# Patient Record
Sex: Male | Born: 1995 | Race: Black or African American | Hispanic: No | Marital: Single | Smoking: Current some day smoker
Health system: Southern US, Community
[De-identification: ages and names within clinical notes are randomized; demographics above are authoritative.]

## PROBLEM LIST (undated history)

## (undated) ENCOUNTER — Ambulatory Visit: Admission: EM | Payer: BC Managed Care – PPO | Source: Home / Self Care

## (undated) ENCOUNTER — Ambulatory Visit: Discharge status: Absent without leave

## (undated) DIAGNOSIS — A599 Trichomoniasis, unspecified: Secondary | ICD-10-CM

## (undated) DIAGNOSIS — N179 Acute kidney failure, unspecified: Secondary | ICD-10-CM

## (undated) DIAGNOSIS — R4589 Other symptoms and signs involving emotional state: Secondary | ICD-10-CM

## (undated) DIAGNOSIS — K219 Gastro-esophageal reflux disease without esophagitis: Secondary | ICD-10-CM

## (undated) DIAGNOSIS — N189 Chronic kidney disease, unspecified: Secondary | ICD-10-CM

## (undated) DIAGNOSIS — M6282 Rhabdomyolysis: Secondary | ICD-10-CM

## (undated) DIAGNOSIS — N451 Epididymitis: Secondary | ICD-10-CM

## (undated) HISTORY — PX: UPPER GI ENDOSCOPY: SHX6162

## (undated) HISTORY — PX: ELBOW SURGERY: SHX618

## (undated) HISTORY — DX: Trichomoniasis, unspecified: A59.9

---
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
## 2013-04-20 ENCOUNTER — Emergency Department (HOSPITAL_COMMUNITY)
Admission: EM | Admit: 2013-04-20 | Discharge: 2013-04-21 | Disposition: A | Discharge status: Home / Self Care | Payer: 59 | Attending: Emergency Medicine | Admitting: Emergency Medicine

## 2013-04-20 ENCOUNTER — Encounter (HOSPITAL_COMMUNITY): Payer: Self-pay | Admitting: Emergency Medicine

## 2013-04-20 DIAGNOSIS — IMO0002 Reserved for concepts with insufficient information to code with codable children: Secondary | ICD-10-CM | POA: Insufficient documentation

## 2013-04-20 DIAGNOSIS — S39848A Other specified injuries of external genitals, initial encounter: Secondary | ICD-10-CM | POA: Insufficient documentation

## 2013-04-20 DIAGNOSIS — Y9389 Activity, other specified: Secondary | ICD-10-CM | POA: Insufficient documentation

## 2013-04-20 DIAGNOSIS — Y929 Unspecified place or not applicable: Secondary | ICD-10-CM | POA: Insufficient documentation

## 2013-04-20 DIAGNOSIS — N453 Epididymo-orchitis: Secondary | ICD-10-CM | POA: Insufficient documentation

## 2013-04-20 DIAGNOSIS — S3994XA Unspecified injury of external genitals, initial encounter: Secondary | ICD-10-CM | POA: Insufficient documentation

## 2013-04-20 DIAGNOSIS — N451 Epididymitis: Secondary | ICD-10-CM

## 2013-04-20 LAB — URINALYSIS, ROUTINE W REFLEX MICROSCOPIC
Bilirubin Urine: NEGATIVE
GLUCOSE, UA: NEGATIVE mg/dL
HGB URINE DIPSTICK: NEGATIVE
Ketones, ur: NEGATIVE mg/dL
LEUKOCYTES UA: NEGATIVE
Nitrite: NEGATIVE
PROTEIN: NEGATIVE mg/dL
SPECIFIC GRAVITY, URINE: 1.029 (ref 1.005–1.030)
UROBILINOGEN UA: 0.2 mg/dL (ref 0.0–1.0)
pH: 5.5 (ref 5.0–8.0)

## 2013-04-20 NOTE — ED Notes (Signed)
The pt has had lt testicle pain and swelling since Saturday.  No voiding difficulty

## 2013-04-21 ENCOUNTER — Emergency Department (HOSPITAL_COMMUNITY): Payer: 59

## 2013-04-21 MED ORDER — IBUPROFEN 200 MG PO TABS
600.0000 mg | ORAL_TABLET | Freq: Once | ORAL | Status: AC
Start: 2013-04-21 — End: 2013-04-21
  Administered 2013-04-21: 600 mg via ORAL
  Filled 2013-04-21: qty 3

## 2013-04-21 MED ORDER — HYDROCODONE-ACETAMINOPHEN 5-325 MG PO TABS: 1.0000 | ORAL_TABLET | Freq: Four times a day (QID) | ORAL | Status: DC | PRN

## 2013-04-21 MED ORDER — IBUPROFEN 600 MG PO TABS: 600.0000 mg | ORAL_TABLET | Freq: Four times a day (QID) | ORAL | Status: DC | PRN

## 2013-04-21 NOTE — ED Provider Notes (Signed)
CSN: [id number]     Arrival date & time 04/20/13  2207 History   First MD Initiated Contact with Patient 04/21/13 0024     Chief Complaint  Patient presents with  . Testicle Pain     (Consider location/radiation/quality/duration/timing/severity/associated sxs/prior Treatment) HPI Comments: Pt comes in with cc of left testicle pain. Pt was doing some sports drill, and he recalls hitting his testicle on the ground. However, he started having pain on Saturday, associated with some swelling. There is no penile discharge, no uri like sx and no hx of same in the past. Pt has no hx of STD, denies any rashes and no risk factors for STD at this time.  Patient is a 18 y.o. male presenting with testicular pain. The history is provided by the patient.  Testicle Pain Pertinent negatives include no abdominal pain.    History reviewed. No pertinent past medical history. Past Surgical History  Procedure Laterality Date  . Elbow surgery     No family history on file. History  Substance Use Topics  . Smoking status: Never Smoker   . Smokeless tobacco: Not on file  . Alcohol Use: No    Review of Systems  Constitutional: Positive for activity change.  Gastrointestinal: Negative for nausea, vomiting and abdominal pain.  Genitourinary: Positive for scrotal swelling and testicular pain. Negative for dysuria, hematuria, discharge and penile swelling.  Skin: Negative for rash and wound.  Hematological: Does not bruise/bleed easily.      Allergies  Review of patient's allergies indicates no known allergies.  Home Medications  No current outpatient prescriptions on file. BP 124/91  Pulse 52  Temp(Src) 97.8 F (36.6 C) (Oral)  Resp 22  Ht 6\' 2"  (1.88 m)  Wt 245 lb (111.131 kg)  BMI 31.44 kg/m2  SpO2 100% Physical Exam  Nursing note and vitals reviewed. Constitutional: He appears well-developed.  HENT:  Head: Atraumatic.  Eyes: Conjunctivae are normal.  Pulmonary/Chest: Effort normal  and breath sounds normal.  Abdominal: Soft. He exhibits no distension. There is no tenderness.  Genitourinary: Penis normal. No penile tenderness.  Left testicles is edematous and tender to palpation. Inguinal hernia check shoes no palpable mass    ED Course  Procedures (including critical care time) Labs Review Labs Reviewed  URINALYSIS, ROUTINE W REFLEX MICROSCOPIC   Imaging Review No results found.   EKG Interpretation None      MDM   Final diagnoses:  None    Pt comes in with testicular pain. States that he had trauma to the scrotum on Thursday, during practice drills. Pt has no penile discharge, uti like sx, hx of STD, and no risk factors for the same. US shows epididymitis. He re-endorses no risk for STDs and doesn't want tx. Will d/c as traumatic epididymitis.   Derwood KaplanAnkit Lyllie Cobbins, MD 04/21/13 [phone]

## 2013-04-21 NOTE — Discharge Instructions (Signed)
Use tight briefs/drawers, DONT USE LOOSE BOXERS. Ice the affected scrotum. Take motrin every 6 hours for the next 2 days. See Urologist if not better. Come to the ER if there is any fevers.   Epididymitis Epididymitis is a swelling (inflammation) of the epididymis. The epididymis is a cord-like structure along the back part of the testicle. Epididymitis is usually, but not always, caused by infection. This is usually a sudden problem beginning with chills, fever and pain behind the scrotum and in the testicle. There may be swelling and redness of the testicle. DIAGNOSIS  Physical examination will reveal a tender, swollen epididymis. Sometimes, cultures are obtained from the urine or from prostate secretions to help find out if there is an infection or if the cause is a different problem. Sometimes, blood work is performed to see if your white blood cell count is elevated and if a germ (bacterial) or viral infection is present. Using this knowledge, an appropriate medicine which kills germs (antibiotic) can be chosen by your caregiver. A viral infection causing epididymitis will most often go away (resolve) without treatment. HOME CARE INSTRUCTIONS   Hot sitz baths for 20 minutes, 4 times per day, may help relieve pain.  Only take over-the-counter or prescription medicines for pain, discomfort or fever as directed by your caregiver.  Take all medicines, including antibiotics, as directed. Take the antibiotics for the full prescribed length of time even if you are feeling better.  It is very important to keep all follow-up appointments. SEEK IMMEDIATE MEDICAL CARE IF:   You have a fever.  You have pain not relieved with medicines.  You have any worsening of your problems.  Your pain seems to come and go.  You develop pain, redness, and swelling in the scrotum and surrounding areas. MAKE SURE YOU:   Understand these instructions.  Will watch your condition.  Will get help right away  if you are not doing well or get worse. Document Released: 01/21/2000 Document Revised: 04/17/2011 Document Reviewed: 12/10/2008 [facility] Patient Information 2014 [employer] MolinaExitCare, [place].

## 2013-04-21 NOTE — ED Notes (Signed)
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 States that he started experiencing pain and tenderness Saturday morning. States that she left testicle hurts worse and has gotten swollen, states that there is no redness.

## 2013-04-23 ENCOUNTER — Other Ambulatory Visit (HOSPITAL_COMMUNITY)
Admission: RE | Admit: 2013-04-23 | Discharge: 2013-04-23 | Disposition: A | Discharge status: Home / Self Care | Payer: 59 | Source: Ambulatory Visit | Attending: Family Medicine | Admitting: Family Medicine

## 2013-04-23 ENCOUNTER — Encounter (HOSPITAL_COMMUNITY): Payer: Self-pay | Admitting: Emergency Medicine

## 2013-04-23 ENCOUNTER — Emergency Department (INDEPENDENT_AMBULATORY_CARE_PROVIDER_SITE_OTHER)
Admission: EM | Admit: 2013-04-23 | Discharge: 2013-04-23 | Disposition: A | Discharge status: Home / Self Care | Payer: 59 | Source: Home / Self Care | Attending: Family Medicine | Admitting: Family Medicine

## 2013-04-23 DIAGNOSIS — N453 Epididymo-orchitis: Secondary | ICD-10-CM

## 2013-04-23 DIAGNOSIS — N451 Epididymitis: Secondary | ICD-10-CM

## 2013-04-23 DIAGNOSIS — Z113 Encounter for screening for infections with a predominantly sexual mode of transmission: Secondary | ICD-10-CM | POA: Insufficient documentation

## 2013-04-23 MED ORDER — AZITHROMYCIN 250 MG PO TABS
1000.0000 mg | ORAL_TABLET | Freq: Once | ORAL | Status: AC
  Administered 2013-04-23: 1000 mg via ORAL

## 2013-04-23 MED ORDER — CEFTRIAXONE SODIUM 250 MG IJ SOLR
INTRAMUSCULAR | Status: AC
  Filled 2013-04-23: qty 250

## 2013-04-23 MED ORDER — AZITHROMYCIN 250 MG PO TABS
ORAL_TABLET | ORAL | Status: AC
  Filled 2013-04-23: qty 4

## 2013-04-23 MED ORDER — LIDOCAINE HCL (PF) 1 % IJ SOLN
INTRAMUSCULAR | Status: AC
  Filled 2013-04-23: qty 5

## 2013-04-23 MED ORDER — CEFTRIAXONE SODIUM 250 MG IJ SOLR
250.0000 mg | Freq: Once | INTRAMUSCULAR | Status: AC
  Administered 2013-04-23: 250 mg via INTRAMUSCULAR

## 2013-04-23 NOTE — ED Provider Notes (Signed)
Caleb ClayJustin Ducksworth is a 18 y.o. male who presents to Urgent Care today for left testicle pain and swelling. Patient was seen in the emergency room March 15 and diagnosed with epididymitis based on ultrasound. He did not receive diagnosis or treatment. His symptoms persist. He denies any penile discharge fevers chills nausea vomiting or diarrhea. He is sexually active with women. He denies any penile lesions.  Patient is a [employer].   History reviewed. No pertinent past medical history. History  Substance Use Topics  . Smoking status: Never Smoker   . Smokeless tobacco: Not on file  . Alcohol Use: No   ROS as above Medications: Current Facility-Administered Medications  Medication Dose Route Frequency Provider Last Rate Last Dose  . azithromycin (ZITHROMAX) tablet 1,000 mg  1,000 mg Oral Once Rodolph BongEvan S Kenyata Napier, MD      . cefTRIAXone (ROCEPHIN) injection 250 mg  250 mg Intramuscular Once Rodolph BongEvan S Cariann Kinnamon, MD       Current Outpatient Prescriptions  Medication Sig Dispense Refill  . HYDROcodone-acetaminophen (NORCO/VICODIN) 5-325 MG per tablet Take 1 tablet by mouth every 6 (six) hours as needed.  8 tablet  0  . ibuprofen (ADVIL,MOTRIN) 600 MG tablet Take 1 tablet (600 mg total) by mouth every 6 (six) hours as needed.  30 tablet  0    Exam:  BP 131/81  Pulse 56  Temp(Src) 97.1 F (36.2 C) (Oral) Gen: Well NAD Genitals: Normal appearing circumcised penis without discharge or lesions Testicles: Right normal descended nontender Left: Swollen and tender. No masses palpated  Assessment and Plan: 18 y.o. male with epididymitis. Urine cytology pending for gonorrhea, Chlamydia, trichomonas. Treatment with empiric ceftriaxone and azithromycin. [facility]  [employer]   Discussed warning signs or symptoms. Please see discharge instructions. Patient expresses understanding.    Rodolph BongEvan S Galen Malkowski, MD 04/23/13 2019

## 2013-04-23 NOTE — Discharge Instructions (Signed)
Thank you for coming in today. Followup with the team physician Come back as needed

## 2013-04-23 NOTE — ED Notes (Signed)
Pt c/o left testicle pain and swelling since 04/19/13 [facility] and dx w/Epididymitis Given hydrocodone for the pain Denies penile d/c, dysuria, f/v/n/d Alert w/no signs of acute distress.

## 2013-04-24 LAB — URINE CYTOLOGY ANCILLARY ONLY
CHLAMYDIA, DNA PROBE: POSITIVE — AB
NEISSERIA GONORRHEA: NEGATIVE
Trichomonas: NEGATIVE

## 2013-04-25 ENCOUNTER — Telehealth (HOSPITAL_COMMUNITY): Payer: Self-pay | Admitting: *Deleted

## 2013-04-25 NOTE — ED Notes (Signed)
GC and Trich neg., Chlamydia pos.  Pt. adequately treated with Zithromax and also got Rocephin.  I called pt. and left a message to call.  Call 1. Caleb Morrison, Caleb Morrison M 04/25/2013

## 2014-10-20 ENCOUNTER — Encounter (HOSPITAL_COMMUNITY): Payer: Self-pay | Admitting: Emergency Medicine

## 2014-10-20 ENCOUNTER — Emergency Department (HOSPITAL_COMMUNITY)
Admission: EM | Admit: 2014-10-20 | Discharge: 2014-10-20 | Disposition: A | Discharge status: Home / Self Care | Payer: Commercial Managed Care - HMO | Attending: Emergency Medicine | Admitting: Emergency Medicine

## 2014-10-20 DIAGNOSIS — R42 Dizziness and giddiness: Secondary | ICD-10-CM | POA: Diagnosis not present

## 2014-10-20 DIAGNOSIS — E860 Dehydration: Secondary | ICD-10-CM | POA: Diagnosis not present

## 2014-10-20 DIAGNOSIS — M542 Cervicalgia: Secondary | ICD-10-CM | POA: Diagnosis not present

## 2014-10-20 LAB — BASIC METABOLIC PANEL
Anion gap: 11 (ref 5–15)
BUN: 23 mg/dL — ABNORMAL HIGH (ref 6–20)
CO2: 24 mmol/L (ref 22–32)
Calcium: 9.5 mg/dL (ref 8.9–10.3)
Chloride: 101 mmol/L (ref 101–111)
Creatinine, Ser: 2.27 mg/dL — ABNORMAL HIGH (ref 0.61–1.24)
GFR calc Af Amer: 46 mL/min — ABNORMAL LOW (ref >60)
GFR calc non Af Amer: 39 mL/min — ABNORMAL LOW (ref >60)
Glucose, Bld: 85 mg/dL (ref 65–99)
Potassium: 4.3 mmol/L (ref 3.5–5.1)
Sodium: 136 mmol/L (ref 135–145)

## 2014-10-20 LAB — I-STAT CREATININE, ED: Creatinine, Ser: 1.9 mg/dL — ABNORMAL HIGH (ref 0.61–1.24)

## 2014-10-20 LAB — CBC WITH DIFFERENTIAL/PLATELET
Basophils Absolute: 0 10*3/uL (ref 0.0–0.1)
Basophils Relative: 0 % (ref 0–1)
Eosinophils Absolute: 0.1 10*3/uL (ref 0.0–0.7)
Eosinophils Relative: 1 % (ref 0–5)
HCT: 48.8 % (ref 39.0–52.0)
Hemoglobin: 17.1 g/dL — ABNORMAL HIGH (ref 13.0–17.0)
Lymphocytes Relative: 13 % (ref 12–46)
Lymphs Abs: 1.7 10*3/uL (ref 0.7–4.0)
MCH: 32.2 pg (ref 26.0–34.0)
MCHC: 35 g/dL (ref 30.0–36.0)
MCV: 91.9 fL (ref 78.0–100.0)
Monocytes Absolute: 0.9 10*3/uL (ref 0.1–1.0)
Monocytes Relative: 7 % (ref 3–12)
Neutro Abs: 9.9 10*3/uL — ABNORMAL HIGH (ref 1.7–7.7)
Neutrophils Relative %: 79 % — ABNORMAL HIGH (ref 43–77)
Platelets: 292 10*3/uL (ref 150–400)
RBC: 5.31 MIL/uL (ref 4.22–5.81)
RDW: 12.2 % (ref 11.5–15.5)
WBC: 12.7 10*3/uL — ABNORMAL HIGH (ref 4.0–10.5)

## 2014-10-20 LAB — URINALYSIS, ROUTINE W REFLEX MICROSCOPIC
Bilirubin Urine: NEGATIVE
Glucose, UA: NEGATIVE mg/dL
Hgb urine dipstick: NEGATIVE
Ketones, ur: 15 mg/dL — AB
Nitrite: NEGATIVE
Protein, ur: NEGATIVE mg/dL
Specific Gravity, Urine: 1.022 (ref 1.005–1.030)
Urobilinogen, UA: 0.2 mg/dL (ref 0.0–1.0)
pH: 5.5 (ref 5.0–8.0)

## 2014-10-20 LAB — URINE MICROSCOPIC-ADD ON

## 2014-10-20 LAB — CK: Total CK: 1169 U/L — ABNORMAL HIGH (ref 49–397)

## 2014-10-20 MED ORDER — SODIUM CHLORIDE 0.9 % IV BOLUS (SEPSIS)
2000.0000 mL | Freq: Once | INTRAVENOUS | Status: AC
  Administered 2014-10-20: 1000 mL via INTRAVENOUS

## 2014-10-20 MED ORDER — SODIUM CHLORIDE 0.9 % IV BOLUS (SEPSIS)
1000.0000 mL | Freq: Once | INTRAVENOUS | Status: AC
  Administered 2014-10-20: 1000 mL via INTRAVENOUS

## 2014-10-20 NOTE — ED Notes (Signed)
Pt is alert and oriented , states he feels much better,  I discussed at length the importance of staying hydrated and no caffeine, pt and his coach understands

## 2014-10-20 NOTE — ED Notes (Signed)
Pt comes to Ed via EMS, complains of fainting and full body cramping. Pt was in extreme pain, and EMS gave 250 fentyal and started NS for a total of 1500Ns. Pt pulled out 16 gage r arm IV, and new one was placed. Pt now has a 20 r hand. No chest pain or SOB.

## 2014-10-20 NOTE — ED Notes (Signed)
Pt is alert and oriented ,  IV fluid was slow , so I put a pressure infuser on to facilitate normal saline

## 2014-10-20 NOTE — ED Provider Notes (Signed)
CSN: [id number]     Arrival date & time 10/20/14  1824 History   First MD Initiated Contact with Patient 10/20/14 1833     Chief Complaint  Patient presents with  . Dehydration     (Consider location/radiation/quality/duration/timing/severity/associated sxs/prior Treatment) HPI  Caleb Morrison is a 19 yo male who presents with an episode of muscle cramping and extreme pain, which has since resolved. Patient states that just before 5 he was at football practice and began to feel lightheaded. He then noticed some cramping that started in his neck then made its way down to his toes. This episode lasted 20-25 minutes. He was able to walk around at first but then once it made its way to his feet he had to lie down on the ground. He was given salt tablets, which did not help his symptoms. When his symptoms had not resolved after 20 minutes the athletic trainer called EMS. He was given Fentanyl by EMS. He is not currently in any pain.   History reviewed. No pertinent past medical history. Past Surgical History  Procedure Laterality Date  . Elbow surgery     History reviewed. No pertinent family history. Social History  Substance Use Topics  . Smoking status: Never Smoker   . Smokeless tobacco: None  . Alcohol Use: No    Review of Systems  All other systems negative except as documented in the HPI. All pertinent positives and negatives as reviewed in the HPI.  Allergies  Review of patient's allergies indicates no known allergies.  Home Medications   Prior to Admission medications   Medication Sig Start Date End Date Taking? Authorizing Provider  HYDROcodone-acetaminophen (NORCO/VICODIN) 5-325 MG per tablet Take 1 tablet by mouth every 6 (six) hours as needed. Patient not taking: Reported on 10/20/2014 04/21/13   Derwood Kaplan, MD  ibuprofen (ADVIL,MOTRIN) 600 MG tablet Take 1 tablet (600 mg total) by mouth every 6 (six) hours as needed. Patient not taking: Reported on 10/20/2014 04/21/13    Derwood Kaplan, MD   BP 134/76 mmHg  Pulse 92  Temp(Src) 97.7 F (36.5 C) (Axillary)  Resp 18  Ht 6' 1.5" (1.867 m)  Wt 245 lb (111.131 kg)  BMI 31.88 kg/m2  SpO2 99% Physical Exam  Constitutional: He is oriented to person, place, and time. He appears well-developed and well-nourished. No distress.  HENT:  Head: Normocephalic and atraumatic.  Mouth/Throat: Oropharynx is clear and moist.  Eyes: Pupils are equal, round, and reactive to light.  Neck: Normal range of motion. Neck supple.  Cardiovascular: Normal rate, regular rhythm and normal heart sounds.  Exam reveals no gallop and no friction rub.   No murmur heard. Pulmonary/Chest: Effort normal and breath sounds normal. No respiratory distress.  Neurological: He is alert and oriented to person, place, and time. He exhibits normal muscle tone. Coordination normal.  Skin: Skin is warm and dry. No erythema.  Psychiatric: He has a normal mood and affect. His behavior is normal.  Nursing note and vitals reviewed.   ED Course  Procedures (including critical care time) Labs Review Labs Reviewed  BASIC METABOLIC PANEL - Abnormal; Notable for the following:    BUN 23 (*)    Creatinine, Ser 2.27 (*)    GFR calc non Af Amer 39 (*)    GFR calc Af Amer 46 (*)    All other components within normal limits  CBC WITH DIFFERENTIAL/PLATELET - Abnormal; Notable for the following:    WBC 12.7 (*)    Hemoglobin  17.1 (*)    Neutrophils Relative % 79 (*)    Neutro Abs 9.9 (*)    All other components within normal limits  CK - Abnormal; Notable for the following:    Total CK 1169 (*)    All other components within normal limits  URINALYSIS, ROUTINE W REFLEX MICROSCOPIC (NOT AT [facility])    Imaging Review No results found. I have personally reviewed and evaluated these images and lab results as part of my medical decision-making.  Feeling vastly improved.  I advised him that he will need to follow up with the team doctor probably not practice  tomorrow.  His creatinine is improving.  His CK was 1169.  The patient is advised to return here for any worsening in his condition.  I do not believe the patient is in rhabdo at this time.  The patient will need clearance from the team doctor.  Patient voices an understanding and his numbers are improving following IV fluids.  He is received almost 4L of normal saline    Charlestine Night, PA-C 10/24/14 0100  Charlestine Night, PA-C 10/24/14 0100  Lorre Nick, MD 10/27/14 1227

## 2014-10-20 NOTE — ED Notes (Signed)
Abnormal I stat creat was given to Dr. Freida Busman.

## 2014-10-20 NOTE — ED Notes (Signed)
Caleb Bible  [phone]  Medical staff from school call when pt is discharged

## 2014-10-20 NOTE — Discharge Instructions (Signed)
Follow-up at the team physician.  I would increase your fluid intake with Gatorade and water.  Return here as needed

## 2014-10-20 NOTE — ED Notes (Signed)
Istat to mini lab, [facility] aware

## 2015-02-15 ENCOUNTER — Other Ambulatory Visit (HOSPITAL_COMMUNITY)
Admission: RE | Admit: 2015-02-15 | Discharge: 2015-02-15 | Disposition: A | Discharge status: Home / Self Care | Payer: Commercial Managed Care - HMO | Source: Ambulatory Visit | Attending: Family Medicine | Admitting: Family Medicine

## 2015-02-15 ENCOUNTER — Encounter (HOSPITAL_COMMUNITY): Payer: Self-pay | Admitting: *Deleted

## 2015-02-15 ENCOUNTER — Emergency Department (INDEPENDENT_AMBULATORY_CARE_PROVIDER_SITE_OTHER)
Admission: EM | Admit: 2015-02-15 | Discharge: 2015-02-15 | Disposition: A | Discharge status: Home / Self Care | Payer: Commercial Managed Care - HMO | Source: Home / Self Care | Attending: Family Medicine | Admitting: Family Medicine

## 2015-02-15 DIAGNOSIS — Z113 Encounter for screening for infections with a predominantly sexual mode of transmission: Secondary | ICD-10-CM | POA: Insufficient documentation

## 2015-02-15 DIAGNOSIS — Z202 Contact with and (suspected) exposure to infections with a predominantly sexual mode of transmission: Secondary | ICD-10-CM

## 2015-02-15 DIAGNOSIS — Z711 Person with feared health complaint in whom no diagnosis is made: Secondary | ICD-10-CM

## 2015-02-15 MED ORDER — CEFTRIAXONE SODIUM 250 MG IJ SOLR
INTRAMUSCULAR | Status: AC
  Filled 2015-02-15: qty 250

## 2015-02-15 MED ORDER — AZITHROMYCIN 250 MG PO TABS
ORAL_TABLET | ORAL | Status: AC
  Filled 2015-02-15: qty 4

## 2015-02-15 MED ORDER — AZITHROMYCIN 250 MG PO TABS
1000.0000 mg | ORAL_TABLET | Freq: Once | ORAL | Status: AC
  Administered 2015-02-15: 1000 mg via ORAL

## 2015-02-15 MED ORDER — CEFTRIAXONE SODIUM 250 MG IJ SOLR
250.0000 mg | Freq: Once | INTRAMUSCULAR | Status: AC
  Administered 2015-02-15: 250 mg via INTRAMUSCULAR

## 2015-02-15 NOTE — ED Notes (Signed)
Phone  Number  Verified          Plan of  Care  Discussed   With pt         Pt  Verbalizes knowledge  Of plan of care

## 2015-02-15 NOTE — ED Provider Notes (Signed)
CSN: [id number]     Arrival date & time 02/15/15  1337 History   First MD Initiated Contact with Patient 02/15/15 1504     Chief Complaint  Patient presents with  . SEXUALLY TRANSMITTED DISEASE   (Consider location/radiation/quality/duration/timing/severity/associated sxs/prior Treatment) Patient is a 20 y.o. male presenting with STD exposure. The history is provided by the patient.  Exposure to STD This is a new problem. The current episode started 2 days ago (told by girl that she had chlamydia and pt here for check, took condom off at her request.). The problem has not changed since onset.Pertinent negatives include no abdominal pain.    History reviewed. No pertinent past medical history. Past Surgical History  Procedure Laterality Date  . Elbow surgery     No family history on file. Social History  Substance Use Topics  . Smoking status: Never Smoker   . Smokeless tobacco: None  . Alcohol Use: No    Review of Systems  Constitutional: Negative.   Gastrointestinal: Negative.  Negative for abdominal pain.  Genitourinary: Positive for testicular pain. Negative for dysuria, urgency, discharge, penile swelling, scrotal swelling, genital sores and penile pain.  Musculoskeletal: Negative.   All other systems reviewed and are negative.   Allergies  Review of patient's allergies indicates no known allergies.  Home Medications   Prior to Admission medications   Medication Sig Start Date End Date Taking? Authorizing Provider  HYDROcodone-acetaminophen (NORCO/VICODIN) 5-325 MG per tablet Take 1 tablet by mouth every 6 (six) hours as needed. Patient not taking: Reported on 10/20/2014 04/21/13   Derwood KaplanAnkit Nanavati, MD  ibuprofen (ADVIL,MOTRIN) 600 MG tablet Take 1 tablet (600 mg total) by mouth every 6 (six) hours as needed. Patient not taking: Reported on 10/20/2014 04/21/13   Derwood KaplanAnkit Nanavati, MD   Meds Ordered and Administered this Visit   Medications  cefTRIAXone (ROCEPHIN) injection  250 mg (not administered)  azithromycin (ZITHROMAX) tablet 1,000 mg (not administered)    BP 158/81 mmHg  Pulse 71  Temp(Src) 97.9 F (36.6 C) (Oral)  Resp 20  SpO2 97% No data found.   Physical Exam  Constitutional: He is oriented to person, place, and time. He appears well-developed and well-nourished. No distress.  Abdominal: Soft. Bowel sounds are normal.  Genitourinary: Prostate normal and penis normal. No penile tenderness.  Neurological: He is alert and oriented to person, place, and time.  Skin: Skin is warm and dry.  Nursing note and vitals reviewed.   ED Course  Procedures (including critical care time)  Labs Review Labs Reviewed  CYTOLOGY, (ORAL, ANAL, URETHRAL) ANCILLARY ONLY    Imaging Review No results found.   Visual Acuity Review  Right Eye Distance:   Left Eye Distance:   Bilateral Distance:    Right Eye Near:   Left Eye Near:    Bilateral Near:         MDM   1. Concern about STD in male without diagnosis       Linna HoffJames D Bular Hickok, MD 02/15/15 1530

## 2015-02-15 NOTE — ED Notes (Signed)
Pt  Reports  Some  Pain in  His  l   Testicle         X  2  Days       denys  Any  Discharge           -   Pt  Reports            Was  Told  He  May  Have  Been  Exposed  To  An  std

## 2015-02-16 LAB — CYTOLOGY, (ORAL, ANAL, URETHRAL) ANCILLARY ONLY
CHLAMYDIA, DNA PROBE: NEGATIVE
Neisseria Gonorrhea: NEGATIVE

## 2015-11-24 ENCOUNTER — Ambulatory Visit (HOSPITAL_COMMUNITY)
Admission: EM | Admit: 2015-11-24 | Discharge: 2015-11-24 | Disposition: A | Discharge status: Home / Self Care | Payer: Commercial Managed Care - HMO | Attending: Family Medicine | Admitting: Family Medicine

## 2015-11-24 ENCOUNTER — Encounter (HOSPITAL_COMMUNITY): Payer: Self-pay | Admitting: Family Medicine

## 2015-11-24 DIAGNOSIS — N50811 Right testicular pain: Secondary | ICD-10-CM | POA: Insufficient documentation

## 2015-11-24 DIAGNOSIS — N50812 Left testicular pain: Secondary | ICD-10-CM | POA: Diagnosis not present

## 2015-11-24 DIAGNOSIS — R1030 Lower abdominal pain, unspecified: Secondary | ICD-10-CM | POA: Insufficient documentation

## 2015-11-24 LAB — POCT URINALYSIS DIP (DEVICE)
BILIRUBIN URINE: NEGATIVE
Glucose, UA: NEGATIVE mg/dL
HGB URINE DIPSTICK: NEGATIVE
Ketones, ur: NEGATIVE mg/dL
Leukocytes, UA: NEGATIVE
NITRITE: NEGATIVE
PH: 6 (ref 5.0–8.0)
Protein, ur: NEGATIVE mg/dL
Specific Gravity, Urine: 1.025 (ref 1.005–1.030)
UROBILINOGEN UA: 0.2 mg/dL (ref 0.0–1.0)

## 2015-11-24 NOTE — ED Provider Notes (Signed)
CSN: [id number]     Arrival date & time 11/24/15  1057 History   First MD Initiated Contact with Patient 11/24/15 1156     Chief Complaint  Patient presents with  . Groin Pain   (Consider location/radiation/quality/duration/timing/severity/associated sxs/prior Treatment) Caleb Morrison is healthy 20 y.o male, presents today for 4-day history of intermittent low abdominal pain and left testicular pain. Patient reports that he had an unprotected sex 4 days ago with a new partner and symptoms started soon after the sexual encounter. He reports the pain to be aching and tingling.  He states that his R testicle also "kind of" hurts but mostly in the left testicle. He reports the pain at the testicle is only "a little bit". He denies penile discharge, penile pain, penile swelling, testicular swelling or redness. He denies dysuria.       History reviewed. No pertinent past medical history. Past Surgical History:  Procedure Laterality Date  . ELBOW SURGERY     History reviewed. No pertinent family history. Social History  Substance Use Topics  . Smoking status: Never Smoker  . Smokeless tobacco: Never Used  . Alcohol use No    Review of Systems  Constitutional: Negative for chills, fatigue and fever.  Respiratory: Negative for cough and shortness of breath.   Cardiovascular: Negative for chest pain, palpitations and leg swelling.  Gastrointestinal: Positive for abdominal pain. Negative for diarrhea, nausea and vomiting.  Genitourinary: Positive for testicular pain. Negative for decreased urine volume, difficulty urinating, discharge, dysuria, flank pain, penile pain, penile swelling, scrotal swelling and urgency.  Neurological: Negative for dizziness and headaches.    Allergies  Review of patient's allergies indicates no known allergies.  Home Medications   Prior to Admission medications   Not on File   Meds Ordered and Administered this Visit  Medications - No data to display  BP  130/98   Pulse 69   Temp 98.1 F (36.7 C)   Resp 18   SpO2 97%  No data found.   Physical Exam  Constitutional: He is oriented to person, place, and time. He appears well-developed and well-nourished.  HENT:  Head: Normocephalic and atraumatic.  Eyes: Pupils are equal, round, and reactive to light.  Neck: Normal range of motion. Neck supple.  Cardiovascular: Normal rate, regular rhythm and normal heart sounds.   Pulmonary/Chest: Effort normal and breath sounds normal. No respiratory distress. He has no wheezes.  Abdominal: Soft. Bowel sounds are normal. He exhibits no mass. There is tenderness.  Mild tenderness over the lower abdomen  Genitourinary:  Genitourinary Comments: No penile discharge, rash, redness or swelling. No testicular swelling or redness. Does endorses some pain over the L testicle upon palpation. No testicular mass palpated.   Lymphadenopathy:    He has no cervical adenopathy.  Neurological: He is alert and oriented to person, place, and time.  Nursing note and vitals reviewed.   Urgent Care Course   Clinical Course    Procedures (including critical care time)  Labs Review Labs Reviewed  POCT URINALYSIS DIP (DEVICE)  URINE CYTOLOGY ANCILLARY ONLY    Imaging Review No results found.  MDM   1. Lower abdominal pain   2. Testicular pain, left   3. Testicular pain, right     1) Patient in no acute distress. Genitourinary exam unremarkable.  2) UA negative today. Cytology result pending. Informed that we will only call him if the results are positive.  3) Patient is safe to be discharged home. Informed to  continue to monitor for symptoms.  4) Emphasized that if he gets worst with testicular swelling, redness or worsening in pain, to go to the ER.   Lucia EstelleFeng Penelopi Mikrut, NP 11/24/15 1251

## 2015-11-24 NOTE — ED Triage Notes (Signed)
Pt here for groin pain and tingling in penis. sts he had unprotected sex 4 days ago. Denies any discharge from penis.

## 2015-11-25 LAB — URINE CYTOLOGY ANCILLARY ONLY
Chlamydia: NEGATIVE
NEISSERIA GONORRHEA: NEGATIVE

## 2016-04-19 ENCOUNTER — Encounter (HOSPITAL_COMMUNITY): Payer: Self-pay | Admitting: Family Medicine

## 2016-04-19 ENCOUNTER — Ambulatory Visit (HOSPITAL_COMMUNITY)
Admission: EM | Admit: 2016-04-19 | Discharge: 2016-04-19 | Disposition: A | Discharge status: Home / Self Care | Payer: Commercial Managed Care - HMO | Attending: Family Medicine | Admitting: Family Medicine

## 2016-04-19 DIAGNOSIS — J039 Acute tonsillitis, unspecified: Secondary | ICD-10-CM

## 2016-04-19 DIAGNOSIS — J029 Acute pharyngitis, unspecified: Secondary | ICD-10-CM | POA: Diagnosis not present

## 2016-04-19 DIAGNOSIS — J00 Acute nasopharyngitis [common cold]: Secondary | ICD-10-CM

## 2016-04-19 DIAGNOSIS — R1310 Dysphagia, unspecified: Secondary | ICD-10-CM | POA: Insufficient documentation

## 2016-04-19 LAB — POCT RAPID STREP A: Streptococcus, Group A Screen (Direct): NEGATIVE

## 2016-04-19 MED ORDER — AMOXICILLIN 500 MG PO CAPS: 1000.0000 mg | ORAL_CAPSULE | Freq: Two times a day (BID) | ORAL | 0 refills | Status: DC

## 2016-04-19 MED ORDER — PREDNISONE 50 MG PO TABS: ORAL_TABLET | ORAL | 0 refills | Status: DC

## 2016-04-19 NOTE — ED Triage Notes (Signed)
Pt here for sore throat since Sunday

## 2016-04-19 NOTE — Discharge Instructions (Signed)
Take the prednisone for the first 3 days. After that you may start ibuprofen 600 mg every 6 hours as needed for throat pain. You may start taking Tylenol for pain as well. Cepacol lozenges to help with throat pain. Start taking the amoxicillin today.

## 2016-04-19 NOTE — ED Provider Notes (Signed)
CSN: [id number]     Arrival date & time 04/19/16  1207 History   First MD Initiated Contact with Patient 04/19/16 1404     Chief Complaint  Patient presents with  . Sore Throat   (Consider location/radiation/quality/duration/timing/severity/associated sxs/prior Treatment) 21 year old male complaining of sore throat with enlarged tonsils and odynophagia. He is also had some sniffles and runny nose.      History reviewed. No pertinent past medical history. Past Surgical History:  Procedure Laterality Date  . ELBOW SURGERY     History reviewed. No pertinent family history. Social History  Substance Use Topics  . Smoking status: Never Smoker  . Smokeless tobacco: Never Used  . Alcohol use No    Review of Systems  Constitutional: Positive for activity change. Negative for chills and fever.  HENT: Positive for congestion, rhinorrhea and sore throat.   Eyes: Negative.   Respiratory: Negative.   Cardiovascular: Negative.   Neurological: Negative.     Allergies  Patient has no known allergies.  Home Medications   Prior to Admission medications   Medication Sig Start Date End Date Taking? Authorizing Provider  amoxicillin (AMOXIL) 500 MG capsule Take 2 capsules (1,000 mg total) by mouth 2 (two) times daily. 04/19/16   Hayden Rasmussenavid Emeril Stille, NP  predniSONE (DELTASONE) 50 MG tablet 1 tab po on day 1, one tab po on day 2, 1/2 tab po on days 3 and 4. Take with food. 04/19/16   Hayden Rasmussenavid Nikira Kushnir, NP   Meds Ordered and Administered this Visit  Medications - No data to display  BP 154/91   Pulse 62   Temp 98.2 F (36.8 C)   Resp 18   SpO2 99%  No data found.   Physical Exam  Constitutional: He is oriented to person, place, and time. He appears well-developed and well-nourished. No distress.  HENT:  Right Ear: External ear normal.  Left Ear: External ear normal.  Bilateral TMs are retracted. No erythema. Oropharynx with erythematous enlarged palatine tonsils covered with gray exudates.  Airway widely patent. Posterior pharynx erythematous and scant clear PND.  Eyes: EOM are normal.  Neck: Normal range of motion. Neck supple. No JVD present.  Cardiovascular: Normal rate, regular rhythm, normal heart sounds and intact distal pulses.   Pulmonary/Chest: Effort normal and breath sounds normal. No respiratory distress.  Musculoskeletal: Normal range of motion. He exhibits no edema.  Lymphadenopathy:    He has no cervical adenopathy.  Neurological: He is alert and oriented to person, place, and time.  Skin: Skin is warm and dry.  Nursing note and vitals reviewed.   Urgent Care Course     Procedures (including critical care time)  Labs Review Labs Reviewed  POCT RAPID STREP A   Results for orders placed or performed during the hospital encounter of 04/19/16  POCT rapid strep A [facility])  Result Value Ref Range   Streptococcus, Group A Screen (Direct) NEGATIVE NEGATIVE     Imaging Review No results found.   Visual Acuity Review  Right Eye Distance:   Left Eye Distance:   Bilateral Distance:    Right Eye Near:   Left Eye Near:    Bilateral Near:         MDM   1. Pharyngitis, unspecified etiology   2. Acute nasopharyngitis   3. Tonsillitis    Take the prednisone for the first 3 days. After that you may start ibuprofen 600 mg every 6 hours as needed for throat pain. You may start taking  Tylenol for pain as well. Cepacol lozenges to help with throat pain. Start taking the amoxicillin today. Meds ordered this encounter  Medications  . amoxicillin (AMOXIL) 500 MG capsule    Sig: Take 2 capsules (1,000 mg total) by mouth 2 (two) times daily.    Dispense:  28 capsule    Refill:  0    Order Specific Question:   Supervising Provider    Answer:   Tyrone Nine [id number]  . predniSONE (DELTASONE) 50 MG tablet    Sig: 1 tab po on day 1, one tab po on day 2, 1/2 tab po on days 3 and 4. Take with food.    Dispense:  3 tablet    Refill:  0    Order  Specific Question:   Supervising Provider    Answer:   Candie Mile       Hayden Rasmussen, NP 04/19/16 1417    Hayden Rasmussen, NP 04/19/16 1422

## 2016-04-21 LAB — CULTURE, GROUP A STREP (THRC)

## 2016-04-25 ENCOUNTER — Encounter (HOSPITAL_COMMUNITY): Payer: Self-pay | Admitting: Adult Health

## 2016-04-25 ENCOUNTER — Emergency Department (HOSPITAL_COMMUNITY)
Admission: EM | Admit: 2016-04-25 | Discharge: 2016-04-25 | Disposition: A | Discharge status: Home / Self Care | Payer: Commercial Managed Care - HMO | Attending: Emergency Medicine | Admitting: Emergency Medicine

## 2016-04-25 ENCOUNTER — Emergency Department (HOSPITAL_COMMUNITY): Payer: Commercial Managed Care - HMO

## 2016-04-25 DIAGNOSIS — J029 Acute pharyngitis, unspecified: Secondary | ICD-10-CM | POA: Diagnosis present

## 2016-04-25 DIAGNOSIS — R911 Solitary pulmonary nodule: Secondary | ICD-10-CM | POA: Diagnosis not present

## 2016-04-25 DIAGNOSIS — R918 Other nonspecific abnormal finding of lung field: Secondary | ICD-10-CM

## 2016-04-25 DIAGNOSIS — J039 Acute tonsillitis, unspecified: Secondary | ICD-10-CM

## 2016-04-25 LAB — I-STAT CHEM 8, ED
BUN: 15 mg/dL (ref 6–20)
CALCIUM ION: 1.17 mmol/L (ref 1.15–1.40)
CHLORIDE: 101 mmol/L (ref 101–111)
Creatinine, Ser: 1.4 mg/dL — ABNORMAL HIGH (ref 0.61–1.24)
Glucose, Bld: 91 mg/dL (ref 65–99)
HEMATOCRIT: 45 % (ref 39.0–52.0)
Hemoglobin: 15.3 g/dL (ref 13.0–17.0)
Potassium: 3.8 mmol/L (ref 3.5–5.1)
SODIUM: 139 mmol/L (ref 135–145)
TCO2: 30 mmol/L (ref 0–100)

## 2016-04-25 LAB — CBC WITH DIFFERENTIAL/PLATELET
BASOS PCT: 1 %
Basophils Absolute: 0.1 10*3/uL (ref 0.0–0.1)
EOS ABS: 0.1 10*3/uL (ref 0.0–0.7)
Eosinophils Relative: 1 %
HCT: 43.2 % (ref 39.0–52.0)
Hemoglobin: 14.2 g/dL (ref 13.0–17.0)
LYMPHS ABS: 2 10*3/uL (ref 0.7–4.0)
Lymphocytes Relative: 20 %
MCH: 31.1 pg (ref 26.0–34.0)
MCHC: 32.9 g/dL (ref 30.0–36.0)
MCV: 94.5 fL (ref 78.0–100.0)
Monocytes Absolute: 1.2 10*3/uL — ABNORMAL HIGH (ref 0.1–1.0)
Monocytes Relative: 12 %
Neutro Abs: 6.8 10*3/uL (ref 1.7–7.7)
Neutrophils Relative %: 68 %
PLATELETS: 292 10*3/uL (ref 150–400)
RBC: 4.57 MIL/uL (ref 4.22–5.81)
RDW: 12.4 % (ref 11.5–15.5)
WBC: 10.1 10*3/uL (ref 4.0–10.5)

## 2016-04-25 LAB — RAPID STREP SCREEN (MED CTR MEBANE ONLY): Streptococcus, Group A Screen (Direct): NEGATIVE

## 2016-04-25 LAB — MONONUCLEOSIS SCREEN: MONO SCREEN: NEGATIVE

## 2016-04-25 MED ORDER — NAPROXEN 500 MG PO TABS: 500.0000 mg | ORAL_TABLET | Freq: Two times a day (BID) | ORAL | 0 refills | Status: DC

## 2016-04-25 MED ORDER — DEXAMETHASONE SODIUM PHOSPHATE 10 MG/ML IJ SOLN
10.0000 mg | Freq: Once | INTRAMUSCULAR | Status: AC
  Administered 2016-04-25: 10 mg via INTRAVENOUS
  Filled 2016-04-25: qty 1

## 2016-04-25 MED ORDER — SODIUM CHLORIDE 0.9 % IV BOLUS (SEPSIS)
1000.0000 mL | Freq: Once | INTRAVENOUS | Status: AC
  Administered 2016-04-25: 1000 mL via INTRAVENOUS

## 2016-04-25 MED ORDER — CLINDAMYCIN HCL 150 MG PO CAPS: 300.0000 mg | ORAL_CAPSULE | Freq: Three times a day (TID) | ORAL | 0 refills | Status: DC

## 2016-04-25 MED ORDER — HYDROCODONE-ACETAMINOPHEN 7.5-325 MG/15ML PO SOLN
10.0000 mL | Freq: Once | ORAL | Status: AC
  Administered 2016-04-25: 10 mL via ORAL
  Filled 2016-04-25: qty 15

## 2016-04-25 MED ORDER — IOPAMIDOL (ISOVUE-300) INJECTION 61%
INTRAVENOUS | Status: AC
  Administered 2016-04-25: 75 mL
  Filled 2016-04-25: qty 75

## 2016-04-25 MED ORDER — HYDROCODONE-ACETAMINOPHEN 5-325 MG PO TABS: 1.0000 | ORAL_TABLET | Freq: Four times a day (QID) | ORAL | 0 refills | Status: DC | PRN

## 2016-04-25 NOTE — ED Provider Notes (Signed)
[facility]PT Provider Note   CSN: [id number] Arrival date & time: 04/25/16  2595     History   Chief Complaint Chief Complaint  Patient presents with  . Sore Throat    HPI Caleb Morrison is a 21 y.o. male.  HPI  This is a 21 year old male who presents with persistent worsening sore throat. Patient was seen and evaluated on March 14 and was diagnosed with pharyngitis. He was started on amoxicillin and prednisone at that time. He is still taking amoxicillin as prescribed. Prednisone has finished. Patient reports persistent odynophagia. He is able to tolerate fluids but reports difficulty with solids. States increasing pain. Current pain is 8 out of 10. It is worse at night. Denies any fevers. Denies any upper respiratory complaints including congestion or cough. Does report at times right ear fullness. Patient does report ulcer under the tongue. Denies any rash or lesions on the palms or soles.  History reviewed. No pertinent past medical history.  There are no active problems to display for this patient.   Past Surgical History:  Procedure Laterality Date  . ELBOW SURGERY         Home Medications    Prior to Admission medications   Medication Sig Start Date End Date Taking? Authorizing Provider  clindamycin (CLEOCIN) 150 MG capsule Take 2 capsules (300 mg total) by mouth 3 (three) times daily. 04/25/16   Shon Baton, MD  HYDROcodone-acetaminophen (NORCO/VICODIN) 5-325 MG tablet Take 1-2 tablets by mouth every 6 (six) hours as needed. 04/25/16   Shon Baton, MD  naproxen (NAPROSYN) 500 MG tablet Take 1 tablet (500 mg total) by mouth 2 (two) times daily. 04/25/16   Shon Baton, MD  predniSONE (DELTASONE) 50 MG tablet 1 tab po on day 1, one tab po on day 2, 1/2 tab po on days 3 and 4. Take with food. 04/19/16   Hayden Rasmussen, NP    Family History History reviewed. No pertinent family history.  Social History Social History  Substance Use Topics  .  Smoking status: Never Smoker  . Smokeless tobacco: Never Used  . Alcohol use No     Allergies   Patient has no known allergies.   Review of Systems Review of Systems  Constitutional: Negative for fever.  HENT: Positive for ear pain, sore throat and trouble swallowing. Negative for congestion.   Respiratory: Negative for cough, chest tightness and shortness of breath.   Cardiovascular: Negative for chest pain.  Gastrointestinal: Negative for abdominal pain, nausea and vomiting.  All other systems reviewed and are negative.    Physical Exam Updated Vital Signs BP (!) 142/81 (BP Location: Left Arm)   Pulse 80   Temp 98.9 F (37.2 C) (Oral)   Resp 18   Ht 6\' 1"  (1.854 m)   Wt 240 lb (108.9 kg)   SpO2 99%   BMI 31.66 kg/m   Physical Exam  Constitutional: He is oriented to person, place, and time. He appears well-developed and well-nourished.  HENT:  Head: Normocephalic and atraumatic.  Slight right greater than left tonsillar enlargement, uvula midline, tonsillar exudate noted bilaterally, mild erythema, normal phonation, ulcers noted  Eyes: Pupils are equal, round, and reactive to light.  Neck: Neck supple.  Cardiovascular: Normal rate, regular rhythm and normal heart sounds.   No murmur heard. Pulmonary/Chest: Effort normal and breath sounds normal. No respiratory distress. He has no wheezes.  Abdominal: Soft. There is no tenderness.  Musculoskeletal: He exhibits no edema.  Lymphadenopathy:  He has cervical adenopathy.  Neurological: He is alert and oriented to person, place, and time.  Skin: Skin is warm and dry. No rash noted.  Psychiatric: He has a normal mood and affect.  Nursing note and vitals reviewed.    ED Treatments / Results  Labs (all labs ordered are listed, but only abnormal results are displayed) Labs Reviewed  CBC WITH DIFFERENTIAL/PLATELET - Abnormal; Notable for the following:       Result Value   Monocytes Absolute 1.2 (*)    All other  components within normal limits  I-STAT CHEM 8, ED - Abnormal; Notable for the following:    Creatinine, Ser 1.40 (*)    All other components within normal limits  RAPID STREP SCREEN (NOT AT [facility])  CULTURE, GROUP A STREP [facility])  MONONUCLEOSIS SCREEN    EKG  EKG Interpretation None       Radiology Ct Soft Tissue Neck W Contrast  Result Date: 04/25/2016 CLINICAL DATA:  Sore throat EXAM: CT NECK WITH CONTRAST TECHNIQUE: Multidetector CT imaging of the neck was performed using the standard protocol following the bolus administration of intravenous contrast. CONTRAST:  75mL ISOVUE-300 IOPAMIDOL (ISOVUE-300) INJECTION 61% COMPARISON:  None. FINDINGS: Pharynx and larynx: The adenoid and palatine tonsils are enlarged and edematous. There is no associated abscess or fluid collection. The lingual tonsils are mildly enlarged but generally unremarkable. There is no retropharyngeal effusion, abscess or lymphadenopathy. The visualized oral cavity is normal. The parapharyngeal spaces are preserved. Salivary glands: The parotid and submandibular glands are normal. No sialolithiasis or salivary ductal dilatation. Thyroid: Normal Lymph nodes: There is a left level IIa lymph node that measures 1.3 cm. A right level IIa node measures 1.6 cm. Vascular: Normal. Limited intracranial: Normal. Visualized orbits: Normal. Mastoids and visualized paranasal sinuses: Clear. Skeleton: Normal. Upper chest: There are multiple small nodular opacities in the right lung apex, measuring up to 5 mm. Other: None. IMPRESSION: 1. Enlarged adenoid and palatine tonsils, consistent with tonsillopharyngitis. No peritonsillar or retropharyngeal abscess or fluid collection. 2. Bilateral reactive level IIa cervical lymphadenopathy. 3. Multiple small nodular opacities in the right lung apex. This is favored to be an infectious or inflammatory process. Apical parenchymal scarring could have the same appearance. No follow-up needed if patient is  low-risk (and has no known or suspected primary neoplasm). Non-contrast chest CT can be considered in 12 months if patient is high-risk. This recommendation follows the consensus statement: Guidelines for Management of Incidental Pulmonary Nodules Detected on CT Images: From the Fleischner Society 2017; Radiology 2017; 284:228-243. Electronically Signed   By: Deatra RobinsonKevin  Herman M.D.   On: 04/25/2016 06:46    Procedures Procedures (including critical care time)  Medications Ordered in ED Medications  dexamethasone (DECADRON) injection 10 mg (10 mg Intravenous Given 04/25/16 0535)  sodium chloride 0.9 % bolus 1,000 mL (1,000 mLs Intravenous New Bag/Given 04/25/16 0540)  HYDROcodone-acetaminophen (HYCET) 7.5-325 mg/15 ml solution 10 mL (10 mLs Oral Given 04/25/16 0539)  iopamidol (ISOVUE-300) 61 % injection (75 mLs  Contrast Given 04/25/16 [id number])     Initial Impression / Assessment and Plan / ED Course  I have reviewed the triage vital signs and the nursing notes.  Pertinent labs & imaging results that were available during my care of the patient were reviewed by me and considered in my medical decision making (see chart for details).     Patient presents with worsening sore throat. Currently on amoxicillin by urgent care. It has slightly increased right tonsil greater than  left. Has persistent tonsillar exudate. Is otherwise nontoxic. Does appear to be tolerating his secretions. Patient given Decadron.  He was additionally given high set. Lab work is reassuring. Monospot is negative. CT scan obtained to evaluate for peritonsillar abscess or deep space infection.  This shows evidence of uncomplicated tonsillitis. Patient is able to tolerate fluids. Will transition patient to clindamycin. Will provide pain medication and have patient follow-up with ENT. Incidentally, he was noted to have pulmonary nodules right upper lobe. He is a smoker. However, he is young. I have discussed this with the patient and he is  to have repeat CT within one year for evaluation of resolution.  After history, exam, and medical workup I feel the patient has been appropriately medically screened and is safe for discharge home. Pertinent diagnoses were discussed with the patient. Patient was given return precautions.   Final Clinical Impressions(s) / ED Diagnoses   Final diagnoses:  Tonsillitis  Pulmonary nodules    New Prescriptions New Prescriptions   CLINDAMYCIN (CLEOCIN) 150 MG CAPSULE    Take 2 capsules (300 mg total) by mouth 3 (three) times daily.   HYDROCODONE-ACETAMINOPHEN (NORCO/VICODIN) 5-325 MG TABLET    Take 1-2 tablets by mouth every 6 (six) hours as needed.   NAPROXEN (NAPROSYN) 500 MG TABLET    Take 1 tablet (500 mg total) by mouth 2 (two) times daily.     Shon Baton, MD 04/25/16 [phone]

## 2016-04-25 NOTE — ED Triage Notes (Signed)
Presents with large edamtous, red tonsils with white exudate, right side bigger than left. REcently started amoxicillin and just finished prednisone. He is taking 2 500mg  amox per day for the past 5 days for pharyngitis. The sore thraot is getting worse. Denies fevers. Able to eat and drink. Pain is svere. Pt has multple chancer sores in mouth and around as well.

## 2016-04-25 NOTE — Discharge Instructions (Signed)
You were seen today for worsening sore throat. You have evidence of tonsillitis. Your antibiotic will be changed. If you develop difficulties swallowing or any new or worsening symptoms she needs be reevaluated. He will be given ENT follow-up. Incidentally, you were noted to have some pulmonary nodules on the upper portion of your right lung. Given the you are smoker, you need a CT scan within one year to ensure resolution.

## 2016-04-27 LAB — CULTURE, GROUP A STREP (THRC)

## 2016-06-28 ENCOUNTER — Observation Stay (HOSPITAL_COMMUNITY): Payer: Commercial Managed Care - HMO

## 2016-06-28 ENCOUNTER — Encounter (HOSPITAL_COMMUNITY): Payer: Self-pay | Admitting: Emergency Medicine

## 2016-06-28 ENCOUNTER — Inpatient Hospital Stay (HOSPITAL_COMMUNITY)
Admission: EM | Admit: 2016-06-28 | Discharge: 2016-06-30 | DRG: 683 | Disposition: A | Discharge status: Home / Self Care | Payer: Commercial Managed Care - HMO | Attending: Internal Medicine | Admitting: Internal Medicine

## 2016-06-28 DIAGNOSIS — D751 Secondary polycythemia: Secondary | ICD-10-CM | POA: Diagnosis present

## 2016-06-28 DIAGNOSIS — R531 Weakness: Secondary | ICD-10-CM | POA: Diagnosis not present

## 2016-06-28 DIAGNOSIS — D72829 Elevated white blood cell count, unspecified: Secondary | ICD-10-CM | POA: Diagnosis not present

## 2016-06-28 DIAGNOSIS — N179 Acute kidney failure, unspecified: Principal | ICD-10-CM | POA: Diagnosis present

## 2016-06-28 DIAGNOSIS — T675XXA Heat exhaustion, unspecified, initial encounter: Secondary | ICD-10-CM | POA: Diagnosis present

## 2016-06-28 DIAGNOSIS — R61 Generalized hyperhidrosis: Secondary | ICD-10-CM | POA: Diagnosis present

## 2016-06-28 DIAGNOSIS — N182 Chronic kidney disease, stage 2 (mild): Secondary | ICD-10-CM | POA: Diagnosis present

## 2016-06-28 DIAGNOSIS — E872 Acidosis, unspecified: Secondary | ICD-10-CM | POA: Diagnosis present

## 2016-06-28 DIAGNOSIS — E869 Volume depletion, unspecified: Secondary | ICD-10-CM | POA: Diagnosis present

## 2016-06-28 DIAGNOSIS — M6282 Rhabdomyolysis: Secondary | ICD-10-CM | POA: Diagnosis present

## 2016-06-28 DIAGNOSIS — X30XXXA Exposure to excessive natural heat, initial encounter: Secondary | ICD-10-CM

## 2016-06-28 LAB — BASIC METABOLIC PANEL
Anion gap: 19 — ABNORMAL HIGH (ref 5–15)
BUN: 24 mg/dL — AB (ref 6–20)
CO2: 20 mmol/L — ABNORMAL LOW (ref 22–32)
CREATININE: 3.18 mg/dL — AB (ref 0.61–1.24)
Calcium: 11.1 mg/dL — ABNORMAL HIGH (ref 8.9–10.3)
Chloride: 98 mmol/L — ABNORMAL LOW (ref 101–111)
GFR calc Af Amer: 30 mL/min — ABNORMAL LOW (ref >60)
GFR, EST NON AFRICAN AMERICAN: 26 mL/min — AB (ref >60)
Glucose, Bld: 126 mg/dL — ABNORMAL HIGH (ref 65–99)
Potassium: 3.6 mmol/L (ref 3.5–5.1)
Sodium: 137 mmol/L (ref 135–145)

## 2016-06-28 LAB — URINALYSIS, ROUTINE W REFLEX MICROSCOPIC
Bacteria, UA: NONE SEEN
Bilirubin Urine: NEGATIVE
GLUCOSE, UA: NEGATIVE mg/dL
Ketones, ur: NEGATIVE mg/dL
Leukocytes, UA: NEGATIVE
NITRITE: NEGATIVE
PH: 6 (ref 5.0–8.0)
Protein, ur: 30 mg/dL — AB
RBC / HPF: NONE SEEN RBC/hpf (ref 0–5)
SPECIFIC GRAVITY, URINE: 1.024 (ref 1.005–1.030)
Squamous Epithelial / LPF: NONE SEEN

## 2016-06-28 LAB — CBC
HEMATOCRIT: 52 % (ref 39.0–52.0)
HEMOGLOBIN: 17.8 g/dL — AB (ref 13.0–17.0)
MCH: 31.1 pg (ref 26.0–34.0)
MCHC: 34.2 g/dL (ref 30.0–36.0)
MCV: 90.9 fL (ref 78.0–100.0)
Platelets: 398 10*3/uL (ref 150–400)
RBC: 5.72 MIL/uL (ref 4.22–5.81)
RDW: 12.6 % (ref 11.5–15.5)
WBC: 14.1 10*3/uL — ABNORMAL HIGH (ref 4.0–10.5)

## 2016-06-28 LAB — SODIUM, URINE, RANDOM: Sodium, Ur: 46 mmol/L

## 2016-06-28 LAB — CREATININE, URINE, RANDOM: Creatinine, Urine: 327.87 mg/dL

## 2016-06-28 MED ORDER — HEPARIN SODIUM (PORCINE) 5000 UNIT/ML IJ SOLN
5000.0000 [IU] | Freq: Three times a day (TID) | INTRAMUSCULAR | Status: DC
  Administered 2016-06-28 – 2016-06-30 (×5): 5000 [IU] via SUBCUTANEOUS
  Filled 2016-06-28 (×5): qty 1

## 2016-06-28 MED ORDER — SODIUM CHLORIDE 0.9 % IV BOLUS (SEPSIS)
1000.0000 mL | Freq: Once | INTRAVENOUS | Status: AC
  Administered 2016-06-28: 1000 mL via INTRAVENOUS

## 2016-06-28 MED ORDER — ACETAMINOPHEN 325 MG PO TABS: 650.0000 mg | ORAL_TABLET | Freq: Four times a day (QID) | ORAL | Status: DC | PRN

## 2016-06-28 MED ORDER — ACETAMINOPHEN 650 MG RE SUPP: 650.0000 mg | Freq: Four times a day (QID) | RECTAL | Status: DC | PRN

## 2016-06-28 MED ORDER — ONDANSETRON HCL 4 MG/2ML IJ SOLN: 4.0000 mg | Freq: Four times a day (QID) | INTRAMUSCULAR | Status: DC | PRN

## 2016-06-28 MED ORDER — SODIUM CHLORIDE 0.9 % IV SOLN
INTRAVENOUS | Status: AC
  Administered 2016-06-28 – 2016-06-29 (×2): via INTRAVENOUS

## 2016-06-28 MED ORDER — ONDANSETRON HCL 4 MG PO TABS: 4.0000 mg | ORAL_TABLET | Freq: Four times a day (QID) | ORAL | Status: DC | PRN

## 2016-06-28 NOTE — ED Notes (Signed)
Pt given supplies to bathe himself.

## 2016-06-28 NOTE — ED Notes (Signed)
Dinner provided to patient from food services

## 2016-06-28 NOTE — Progress Notes (Signed)
Received patient from ED accompanied by girlfriend and relative.  Patient AOx4, ambulatory, denies pain and VS stable with slightly elevated BP at 146/74 after 2000 cc NS boluses in ED.  Patient oriented to room, bed controls and call light.  Will monitor.

## 2016-06-28 NOTE — ED Triage Notes (Signed)
Per EMS: Pt was working outside and got weak. Pt was shivering and diaphoretic. Pt c/o of muscle cramps all over his body.  Pt A&Ox4 Recived 500 NS.

## 2016-06-28 NOTE — ED Notes (Signed)
Pt given Gatorade x 2

## 2016-06-28 NOTE — ED Notes (Signed)
Patient transported to Ultrasound 

## 2016-06-28 NOTE — ED Provider Notes (Signed)
21 year old male was working outside and got weak and noted muscle cramps. He was sweating profusely. He did not get oriented and he never stops sweating. He thinks that perhaps he was not drinking enough. On exam, he is now awake and alert and oriented. Lungs are clear and heart is regular rate and rhythm. He will get IV fluids. Screening labs are checked.  Medical screening examination/treatment/procedure(s) were conducted as a shared visit with non-physician practitioner(s) and myself.  I personally evaluated the patient during the encounter.   EKG Interpretation  Date/Time:  Wednesday Jun 28 2016 16:35:53 EDT Ventricular Rate:  115 PR Interval:    QRS Duration: 101 QT Interval:  319 QTC Calculation: 442 R Axis:   25 Text Interpretation:  Sinus tachycardia Right atrial enlargement Borderline ST elevation, anterior leads Artifact in lead(s) I II III aVR aVL aVF V1 V2 V3 V4 V5 V6 No old tracing to compare Confirmed by Dione BoozeGlick, Julieanna Geraci ([id number]) on 06/28/2016 4:48:18 PM         Dione BoozeGlick, Gentry Pilson, MD 06/28/16 [phone]

## 2016-06-28 NOTE — H&P (Signed)
History and Physical    Caleb ClayJustin Eatherly RUE:[id number] DOB: [birth year] DOA: 06/28/2016  PCP: System, Pcp Not In   Patient coming from: Home  Chief Complaint: Acute weakness, diaphoresis, muscle cramps  HPI: Caleb Morrison is a 21 y.o. male who denies any significant past medical history, but has developed acute kidney injury in the past setting of dehydration, now presenting to the emergency department with diaphoresis, lethargy, and diffuse muscle cramps after performing heavy manual labor outdoors in the heat. Patient reports that he is been in his usual state of health, recently starting a job as a Administratorlandscaper. Yesterday, after working out in the sun for several hours, patient became lethargic and developed cramping in his legs. His boss sent him home early, he felt better this morning, and return to work. After working outside in the heat again, sweating profusely, the patient developed acute generalized weakness and lethargy with diffuse muscle cramps. He cut out of the sun and began drinking some fluids, but continued to sweat profusely. Eventually, EMS was called out for evaluation and the patient was treated with 500 mL normal saline and transported to the hospital.    ED Course: Upon arrival to the ED, patient is found to be afebrile, saturating well on room air, and with vital signs stable. EKG features a sinus tachycardia with rate 115. Chemistry panels notable for a bicarbonate of 20, BUN 24, creatinine 3.18, up from 1.40 in March of this year, and anion gap of 19. CBC is notable for a leukocytosis to 14,100 and a hemoglobin of 17.8. Patient was given a liter of normal saline in the emergency department and he drank 2 bottles of Gatorade. Tachycardia resolved with the IV fluids and the patient has remained hemodynamically stable and in no respiratory distress. He will be observed on the medical surgical unit for acute renal failure, likely prerenal in the setting of diaphoresis and inadequate fluid  intake.  Review of Systems:  All other systems reviewed and apart from HPI, are negative.  History reviewed. No pertinent past medical history.  Past Surgical History:  Procedure Laterality Date  . ELBOW SURGERY       reports that he has never smoked. He has never used smokeless tobacco. He reports that he does not drink alcohol. His drug history is not on file.  No Known Allergies  History reviewed. No pertinent family history.   Prior to Admission medications   Not on File    Physical Exam: Vitals:   06/28/16 1642 06/28/16 1700 06/28/16 1800 06/28/16 1923  BP: (!) 149/134 109/73 (!) 141/105 130/80  Pulse: 94 81 83 82  Resp: (!) 21 (!) 8 (!) 21 18  Temp: 97.6 F (36.4 C)     TempSrc: Axillary     SpO2: 99% 96% 99% 100%  Weight:      Height:          Constitutional: NAD, calm, comfortable Eyes: PERTLA, lids and conjunctivae normal ENMT: Mucous membranes are tacky. Posterior pharynx clear of any exudate or lesions.   Neck: normal, supple, no masses, no thyromegaly Respiratory: clear to auscultation bilaterally, no wheezing, no crackles. Normal respiratory effort.   Cardiovascular: S1 & S2 heard, regular rate and rhythm. No extremity edema. 2+ pedal pulses. No significant JVD. Abdomen: No distension, no tenderness, no masses palpated. Bowel sounds normal.  Musculoskeletal: no clubbing / cyanosis. No joint deformity upper and lower extremities. Normal muscle tone.  Skin: no significant rashes, lesions, ulcers. Warm, dry, well-perfused. Poor turgor.  Neurologic: CN 2-12 grossly intact. Sensation intact, DTR normal. Strength 5/5 in all 4 limbs.  Psychiatric: Alert and oriented x 3. Calm and cooperative.     Labs on Admission: I have personally reviewed following labs and imaging studies  CBC:  Recent Labs Lab 06/28/16 1645  WBC 14.1*  HGB 17.8*  HCT 52.0  MCV 90.9  PLT 398   Basic Metabolic Panel:  Recent Labs Lab 06/28/16 1645  NA 137  K 3.6  CL  98*  CO2 20*  GLUCOSE 126*  BUN 24*  CREATININE 3.18*  CALCIUM 11.1*   GFR: Estimated Creatinine Clearance: 47.2 mL/min (A) (by C-G formula based on SCr of 3.18 mg/dL (H)). Liver Function Tests: No results for input(s): AST, ALT, ALKPHOS, BILITOT, PROT, ALBUMIN in the last 168 hours. No results for input(s): LIPASE, AMYLASE in the last 168 hours. No results for input(s): AMMONIA in the last 168 hours. Coagulation Profile: No results for input(s): INR, PROTIME in the last 168 hours. Cardiac Enzymes: No results for input(s): CKTOTAL, CKMB, CKMBINDEX, TROPONINI in the last 168 hours. BNP (last 3 results) No results for input(s): PROBNP in the last 8760 hours. HbA1C: No results for input(s): HGBA1C in the last 72 hours. CBG: No results for input(s): GLUCAP in the last 168 hours. Lipid Profile: No results for input(s): CHOL, HDL, LDLCALC, TRIG, CHOLHDL, LDLDIRECT in the last 72 hours. Thyroid Function Tests: No results for input(s): TSH, T4TOTAL, FREET4, T3FREE, THYROIDAB in the last 72 hours. Anemia Panel: No results for input(s): VITAMINB12, FOLATE, FERRITIN, TIBC, IRON, RETICCTPCT in the last 72 hours. Urine analysis:    Component Value Date/Time   COLORURINE YELLOW 10/20/2014 2238   APPEARANCEUR CLOUDY (A) 10/20/2014 2238   LABSPEC 1.025 11/24/2015 1200   PHURINE 6.0 11/24/2015 1200   GLUCOSEU NEGATIVE 11/24/2015 1200   HGBUR NEGATIVE 11/24/2015 1200   BILIRUBINUR NEGATIVE 11/24/2015 1200   KETONESUR NEGATIVE 11/24/2015 1200   PROTEINUR NEGATIVE 11/24/2015 1200   UROBILINOGEN 0.2 11/24/2015 1200   NITRITE NEGATIVE 11/24/2015 1200   LEUKOCYTESUR NEGATIVE 11/24/2015 1200   Sepsis Labs: @LABRCNTIP (procalcitonin:4,lacticidven:4) )No results found for this or any previous visit (from the past 240 hour(s)).   Radiological Exams on Admission: No results found.  EKG: Independently reviewed. Sinus tachycardia (rate 115).   Assessment/Plan  1. Acute renal failure,  metabolic acidosis   - SCr is 3.18 on admission, up from 1.40 in March 2018  - Likely a prerenal azotemia in setting of new manual-labor job, working in sun with profuse sweating and inadequate oral intake   - He was given a liter on NS and two bottles of Gatorade in ED  - Plan to exclude obstruction with renal US, check urine studies, continue fluid-resuscitation with NS, avoid nephrotoxins, and repeat chem panel in am   2. Leukocytosis  - WBC is 14,100 on admission with no fever or apparent infection  - Likely hemoconcentration as Hgb noted to be 17.8  - Culture if febrile, continue fluid-resuscitation, repeat CBC in am     DVT prophylaxis: sq heparin  Code Status: Full  Family Communication: Discussed with patient Disposition Plan: Observe on med-surg Consults called: None Admission status: Observation    Briscoe Deutscher, MD [facility] Pager [phone]  If 7PM-7AM, please contact night-coverage [email or web address] Password [facility]  06/28/2016, 7:28 PM

## 2016-06-29 DIAGNOSIS — N179 Acute kidney failure, unspecified: Secondary | ICD-10-CM | POA: Diagnosis not present

## 2016-06-29 DIAGNOSIS — R61 Generalized hyperhidrosis: Secondary | ICD-10-CM | POA: Diagnosis present

## 2016-06-29 DIAGNOSIS — T675XXA Heat exhaustion, unspecified, initial encounter: Secondary | ICD-10-CM | POA: Diagnosis present

## 2016-06-29 DIAGNOSIS — M6282 Rhabdomyolysis: Secondary | ICD-10-CM

## 2016-06-29 DIAGNOSIS — E869 Volume depletion, unspecified: Secondary | ICD-10-CM | POA: Diagnosis present

## 2016-06-29 DIAGNOSIS — X30XXXA Exposure to excessive natural heat, initial encounter: Secondary | ICD-10-CM | POA: Diagnosis not present

## 2016-06-29 DIAGNOSIS — D72829 Elevated white blood cell count, unspecified: Secondary | ICD-10-CM | POA: Diagnosis present

## 2016-06-29 DIAGNOSIS — R531 Weakness: Secondary | ICD-10-CM | POA: Diagnosis present

## 2016-06-29 DIAGNOSIS — E872 Acidosis: Secondary | ICD-10-CM | POA: Diagnosis present

## 2016-06-29 DIAGNOSIS — D751 Secondary polycythemia: Secondary | ICD-10-CM | POA: Diagnosis present

## 2016-06-29 LAB — CBC
HCT: 42.6 % (ref 39.0–52.0)
HEMOGLOBIN: 14.2 g/dL (ref 13.0–17.0)
MCH: 30.2 pg (ref 26.0–34.0)
MCHC: 33.3 g/dL (ref 30.0–36.0)
MCV: 90.6 fL (ref 78.0–100.0)
Platelets: 307 10*3/uL (ref 150–400)
RBC: 4.7 MIL/uL (ref 4.22–5.81)
RDW: 12.4 % (ref 11.5–15.5)
WBC: 12.9 10*3/uL — ABNORMAL HIGH (ref 4.0–10.5)

## 2016-06-29 LAB — HIV ANTIBODY (ROUTINE TESTING W REFLEX): HIV Screen 4th Generation wRfx: NONREACTIVE

## 2016-06-29 LAB — BASIC METABOLIC PANEL
Anion gap: 6 (ref 5–15)
BUN: 21 mg/dL — AB (ref 6–20)
CHLORIDE: 105 mmol/L (ref 101–111)
CO2: 25 mmol/L (ref 22–32)
CREATININE: 1.73 mg/dL — AB (ref 0.61–1.24)
Calcium: 8.4 mg/dL — ABNORMAL LOW (ref 8.9–10.3)
GFR calc Af Amer: >60 mL/min (ref >60)
GFR calc non Af Amer: 54 mL/min — ABNORMAL LOW (ref >60)
GLUCOSE: 100 mg/dL — AB (ref 65–99)
POTASSIUM: 4 mmol/L (ref 3.5–5.1)
Sodium: 136 mmol/L (ref 135–145)

## 2016-06-29 LAB — CK: Total CK: 2055 U/L — ABNORMAL HIGH (ref 49–397)

## 2016-06-29 MED ORDER — SODIUM CHLORIDE 0.9 % IV SOLN
INTRAVENOUS | Status: DC
  Administered 2016-06-29 – 2016-06-30 (×4): via INTRAVENOUS

## 2016-06-29 NOTE — Progress Notes (Signed)
PROGRESS NOTE                                                                                                                                                                                                             Patient Demographics:    Caleb Morrison, is a 21 y.o. male, DOB - Jan 21, 1996, ZOX:[id number]  Admit date - 06/28/2016   Admitting Physician Briscoe Deutscher, MD  Outpatient Primary MD for the patient is System, Pcp Not In  LOS - 0   Chief Complaint  Patient presents with  . Heat Exposure    Brief Narrative   21 year old male without significant past medical history, presents with heat exhaustion, muscle cramps, workup significant for acute renal failure Rhabdomyolysis.    Subjective:    Hilliard Borges todayReports he is feeling much better, denies any muscle cramps, dizziness, lightheadedness,    Assessment  & Plan :    Principal Problem:   AKI (acute kidney injury) (HCC) Active Problems:   CKD (chronic kidney disease), stage II   Leukocytosis   Metabolic acidosis   Rhabdomyolysis  Acute renal failure  - secondary to rhabdomyolysis and volume depletion -  baseline creatinine 1.4, creatinine 3.7 on admission, today is 1.7 with IV hydration, continue with aggressive IV fluids, avoid nephrotoxic medications.  Rhabdomyolysis - Secondary to excessive work, and the exertion, renal function improving, total CK elevated at 2000, will increase IV normal saline to 150 mL/h, recheck CBC, BMP and CK total in a.m..  Leukocytosis/polycythemia - This is secondary to hemoconcentration, resolved  with hydration   Code Status : Full  Family Communication  : Friend at bedside  Disposition Plan  : home when [place]  Consults  :  None  Procedures  : None  DVT Prophylaxis  :   Heparin - SCDs   Lab Results  Component Value Date   PLT 307 06/29/2016    Antibiotics  :    Anti-infectives    None        Objective:   Vitals:   06/28/16 1923 06/28/16 2038 06/29/16 0454 06/29/16 0517  BP: 130/80 (!) 146/74  131/71  Pulse: 82 76  66  Resp: 18 18  18   Temp:  98.1 F (36.7 C)  97 F (36.1 C)  TempSrc:  Oral  Oral  SpO2: 100% 100%  100%  Weight:  110.2 kg (243 lb) 112.5 kg (248 lb 0.3 oz)   Height:  6\' 1"  (1.854 m)      Wt Readings from Last 3 Encounters:  06/29/16 112.5 kg (248 lb 0.3 oz)  04/25/16 108.9 kg (240 lb)  10/20/14 111.1 kg (245 lb)     Intake/Output Summary (Last 24 hours) at 06/29/16 1205 Last data filed at 06/29/16 1100  Gross per 24 hour  Intake          3486.24 ml  Output              900 ml  Net          2586.24 ml     Physical Exam  Awake Alert, Oriented X 3, No new F.N deficits, Normal affect Symmetrical Chest wall movement, Good air movement bilaterally, CTAB RRR,No Gallops,Rubs or new Murmurs, No Parasternal Heave +ve B.Sounds, Abd Soft, No tenderness,  No rebound - guarding or rigidity. No Cyanosis, Clubbing or edema, No new Rash or bruise      Data Review:    CBC  Recent Labs Lab 06/28/16 1645 06/29/16 0333  WBC 14.1* 12.9*  HGB 17.8* 14.2  HCT 52.0 42.6  PLT 398 307  MCV 90.9 90.6  MCH 31.1 30.2  MCHC 34.2 33.3  RDW 12.6 12.4    Chemistries   Recent Labs Lab 06/28/16 1645 06/29/16 0333  NA 137 136  K 3.6 4.0  CL 98* 105  CO2 20* 25  GLUCOSE 126* 100*  BUN 24* 21*  CREATININE 3.18* 1.73*  CALCIUM 11.1* 8.4*   ------------------------------------------------------------------------------------------------------------------ No results for input(s): CHOL, HDL, LDLCALC, TRIG, CHOLHDL, LDLDIRECT in the last 72 hours.  No results found for: HGBA1C ------------------------------------------------------------------------------------------------------------------ No results for input(s): TSH, T4TOTAL, T3FREE, THYROIDAB in the last 72 hours.  Invalid input(s):  FREET3 ------------------------------------------------------------------------------------------------------------------ No results for input(s): VITAMINB12, FOLATE, FERRITIN, TIBC, IRON, RETICCTPCT in the last 72 hours.  Coagulation profile No results for input(s): INR, PROTIME in the last 168 hours.  No results for input(s): DDIMER in the last 72 hours.  Cardiac Enzymes No results for input(s): CKMB, TROPONINI, MYOGLOBIN in the last 168 hours.  Invalid input(s): CK ------------------------------------------------------------------------------------------------------------------ No results found for: BNP  Inpatient Medications  Scheduled Meds: . heparin  5,000 Units Subcutaneous Q8H   Continuous Infusions: . sodium chloride 150 mL/hr at 06/29/16 1152   PRN Meds:.acetaminophen **OR** acetaminophen, ondansetron **OR** ondansetron (ZOFRAN) IV  Micro Results No results found for this or any previous visit (from the past 240 hour(s)).  Radiology Reports [facility]  Result Date: 06/28/2016 CLINICAL DATA:  21 y/o  M; acute kidney injury. EXAM: RENAL / URINARY TRACT ULTRASOUND COMPLETE COMPARISON:  None. FINDINGS: Right Kidney: Length: 11.5 cm. Echogenicity within normal limits. No mass or hydronephrosis visualized. Left Kidney: Length: 12.0 cm. Echogenicity within normal limits. No mass or hydronephrosis visualized. Bladder: Appears normal for degree of bladder distention. Bilateral ureteral jets visualized. Other:  1.5 cm echogenic focus in the right lobe of liver. IMPRESSION: 1. Unremarkable appearance of the kidneys.  No hydronephrosis. 2. 1.5 cm echogenic focus in right lobe of liver, this is probably a hemangioma. Consider follow-up with ultrasound to ensure stability. Electronically Signed   By: Mitzi HansenLance  Furusawa-Stratton M.D.   On: 06/28/2016 20:26    Randol KernELGERGAWY, Briston Lax M.D on 06/29/2016 at 12:05 PM  Between 7am to 7pm - Pager - [phone]  After 7pm go to [email or web address] -  password [email or web address]  [facility] -  Office  [phone]

## 2016-06-30 LAB — CBC
HCT: 41.6 % (ref 39.0–52.0)
Hemoglobin: 13.6 g/dL (ref 13.0–17.0)
MCH: 30.2 pg (ref 26.0–34.0)
MCHC: 32.7 g/dL (ref 30.0–36.0)
MCV: 92.4 fL (ref 78.0–100.0)
PLATELETS: 269 10*3/uL (ref 150–400)
RBC: 4.5 MIL/uL (ref 4.22–5.81)
RDW: 12.6 % (ref 11.5–15.5)
WBC: 5.7 10*3/uL (ref 4.0–10.5)

## 2016-06-30 LAB — UREA NITROGEN, URINE: Urea Nitrogen, Ur: 1539 mg/dL

## 2016-06-30 LAB — BASIC METABOLIC PANEL
Anion gap: 5 (ref 5–15)
BUN: 14 mg/dL (ref 6–20)
CALCIUM: 8.4 mg/dL — AB (ref 8.9–10.3)
CO2: 24 mmol/L (ref 22–32)
CREATININE: 1.22 mg/dL (ref 0.61–1.24)
Chloride: 107 mmol/L (ref 101–111)
GFR calc Af Amer: >60 mL/min (ref >60)
GLUCOSE: 105 mg/dL — AB (ref 65–99)
Potassium: 3.9 mmol/L (ref 3.5–5.1)
SODIUM: 136 mmol/L (ref 135–145)

## 2016-06-30 LAB — CK: Total CK: 1925 U/L — ABNORMAL HIGH (ref 49–397)

## 2016-06-30 NOTE — Care Management Note (Signed)
Case Management Note  Patient Details  Name: Linard Daft MRN: [id number] Date of Birth: 12-May-1995  Subjective/Objective:    Form home , presents with  AKI, Rhabdo, met acidosis, CKDE, leukocytosis.  Patient states he is from [place], he has a pcp back home, but for right now will be in [place].  NCM gave patient the [facility] information to help ast patient with finding a PCP in network with his insurance.  Patient  Is for dc today, and has no other needs.               Action/Plan:   Expected Discharge Date:  06/30/16               Expected Discharge Plan:  Home/Self Care  In-House Referral:     Discharge planning Services  CM Consult  Post Acute Care Choice:    Choice offered to:     DME Arranged:    DME Agency:     HH Arranged:    HH Agency:     Status of Service:  Completed, signed off  If discussed at [employer] of Stay Meetings, dates discussed:    Additional Comments:  Zenon Mayo, RN 06/30/2016, 2:15 PM

## 2016-06-30 NOTE — Discharge Instructions (Signed)
Follow with Primary MD (she reports he will find PCP) Get CBC, CMP, CKchecked  by Primary MD next visit.    Activity: As tolerated with Full fall precautions use walker/cane & assistance as needed   Disposition Home    Diet:  Regular diet  On your next visit with your primary care physician please Get Medicines reviewed and adjusted.   Please request your Prim.MD to go over all Hospital Tests and Procedure/Radiological results at the follow up, please get all Hospital records sent to your Prim MD by signing hospital release before you go home.   If you experience worsening of your admission symptoms, develop shortness of breath, life threatening emergency, suicidal or homicidal thoughts you must seek medical attention immediately by calling 911 or calling your MD immediately  if symptoms less severe.  You Must read complete instructions/literature along with all the possible adverse reactions/side effects for all the Medicines you take and that have been prescribed to you. Take any new Medicines after you have completely understood and accpet all the possible adverse reactions/side effects.   Do not drive, operating heavy machinery, perform activities at heights, swimming or participation in water activities or provide baby sitting services if your were admitted for syncope or siezures until you have seen by Primary MD or a Neurologist and advised to do so again.  Do not drive when taking Pain medications.    Do not take more than prescribed Pain, Sleep and Anxiety Medications  Special Instructions: If you have smoked or chewed Tobacco  in the last 2 yrs please stop smoking, stop any regular Alcohol  and or any Recreational drug use.  Wear Seat belts while driving.   Please note  You were cared for by a hospitalist during your hospital stay. If you have any questions about your discharge medications or the care you received while you were in the hospital after you are discharged,  you can call the unit and asked to speak with the hospitalist on call if the hospitalist that took care of you is not available. Once you are discharged, your primary care physician will handle any further medical issues. Please note that NO REFILLS for any discharge medications will be authorized once you are discharged, as it is imperative that you return to your primary care physician (or establish a relationship with a primary care physician if you do not have one) for your aftercare needs so that they can reassess your need for medications and monitor your lab values.

## 2016-06-30 NOTE — Progress Notes (Signed)
D/C papers gone over with pt. No questions/complaints. Encouraged pt. To get a PCP. Pt. States that he will. Refused w/c. Pt. D/c'd successfully.

## 2016-06-30 NOTE — Discharge Summary (Signed)
Caleb Morrison, is a 21 y.o. male  DOB 1995/05/01  MRN [id number].  Admission date:  06/28/2016  Admitting Physician  Briscoe Deutscher, MD  Discharge Date:  06/30/2016   Primary MD  System, Pcp Not In  Recommendations for primary care physician for things to follow:  - please check  CBC, BMP, total CK during next visit - 1.5 cm echogenic focus in right lobe of liver, this is probably a hemangioma. Consider follow-up with ultrasound to ensure stability   Admission Diagnosis  AKI (acute kidney injury) (HCC) [N17.9] Heat exhaustion, initial encounter [T67.5XXA]   Discharge Diagnosis  AKI (acute kidney injury) (HCC) [N17.9] Heat exhaustion, initial encounter [T67.5XXA]    Principal Problem:   AKI (acute kidney injury) (HCC) Active Problems:   CKD (chronic kidney disease), stage II   Leukocytosis   Metabolic acidosis   Rhabdomyolysis      History reviewed. No pertinent past medical history.  Past Surgical History:  Procedure Laterality Date  . ELBOW SURGERY         History of present illness and  Hospital Course:     Kindly see H&P for history of present illness and admission details, please review complete Labs, Consult reports and Test reports for all details in brief  HPI  from the history and physical done on the day of admission 06/28/2016  HPI: Caleb Morrison is a 21 y.o. male who denies any significant past medical history, but has developed acute kidney injury in the past setting of dehydration, now presenting to the emergency department with diaphoresis, lethargy, and diffuse muscle cramps after performing heavy manual labor outdoors in the heat. Patient reports that he is been in his usual state of health, recently starting a job as a Administrator. Yesterday, after working out in the sun for several hours, patient became lethargic and developed cramping in his legs. His boss sent him home  early, he felt better this morning, and return to work. After working outside in the heat again, sweating profusely, the patient developed acute generalized weakness and lethargy with diffuse muscle cramps. He cut out of the sun and began drinking some fluids, but continued to sweat profusely. Eventually, EMS was called out for evaluation and the patient was treated with 500 mL normal saline and transported to the hospital.    ED Course: Upon arrival to the ED, patient is found to be afebrile, saturating well on room air, and with vital signs stable. EKG features a sinus tachycardia with rate 115. Chemistry panels notable for a bicarbonate of 20, BUN 24, creatinine 3.18, up from 1.40 in March of this year, and anion gap of 19. CBC is notable for a leukocytosis to 14,100 and a hemoglobin of 17.8. Patient was given a liter of normal saline in the emergency department and he drank 2 bottles of Gatorade. Tachycardia resolved with the IV fluids and the patient has remained hemodynamically stable and in no respiratory distress. He will be observed on the medical surgical unit for acute renal failure, likely  prerenal in the setting of diaphoresis and inadequate fluid intake.  Hospital Course  21 year old male without significant past medical history, presents with heat exhaustion, muscle cramps, workup significant for acute renal failure Rhabdomyolysis.  Acute renal failure  - secondary to rhabdomyolysis and volume depletion -  baseline creatinine 1.4, creatinine 3.7 on admission, improved with IV hydration, creatinine is 1.2 today . - Acute renal failure resolved at time of discharge .  Rhabdomyolysis - Secondary to excessive work, warm weather, and volume depletion ,total CK elevated at 2000, he was on IV fluids during hospital stay on 150 mL/h, repeat total CK this a.m. is 1.925,  encouraged to continue  fluid intake including Gatorade  Leukocytosis/polycythemia - This is secondary to  hemoconcentration, resolved  with hydration  1.5 cm echogenic focus in right lobe of liver  - this is probably a hemangioma. Consider follow-up with ultrasound to ensure stability, the patient was notified about these findings, and instructed to inform his new PCP with these findings so he will follow up appropriately   Discharge Condition:  stable   Follow UP   with pcp   Discharge Instructions  and  Discharge Medications     Discharge Instructions    Discharge instructions    Complete by:  As directed    Follow with Primary MD (she reports he will find PCP) Get CBC, CMP, CKchecked  by Primary MD next visit.    Activity: As tolerated with Full fall precautions use walker/cane & assistance as needed   Disposition Home    Diet:  Regular diet  On your next visit with your primary care physician please Get Medicines reviewed and adjusted.   Please request your Prim.MD to go over all Hospital Tests and Procedure/Radiological results at the follow up, please get all Hospital records sent to your Prim MD by signing hospital release before you go home.   If you experience worsening of your admission symptoms, develop shortness of breath, life threatening emergency, suicidal or homicidal thoughts you must seek medical attention immediately by calling 911 or calling your MD immediately  if symptoms less severe.  You Must read complete instructions/literature along with all the possible adverse reactions/side effects for all the Medicines you take and that have been prescribed to you. Take any new Medicines after you have completely understood and accpet all the possible adverse reactions/side effects.   Do not drive, operating heavy machinery, perform activities at heights, swimming or participation in water activities or provide baby sitting services if your were admitted for syncope or siezures until you have seen by Primary MD or a Neurologist and advised to do so again.  Do  not drive when taking Pain medications.    Do not take more than prescribed Pain, Sleep and Anxiety Medications  Special Instructions: If you have smoked or chewed Tobacco  in the last 2 yrs please stop smoking, stop any regular Alcohol  and or any Recreational drug use.  Wear Seat belts while driving.   Please note  You were cared for by a hospitalist during your hospital stay. If you have any questions about your discharge medications or the care you received while you were in the hospital after you are discharged, you can call the unit and asked to speak with the hospitalist on call if the hospitalist that took care of you is not available. Once you are discharged, your primary care physician will handle any further medical issues. Please note that NO REFILLS for any discharge  medications will be authorized once you are discharged, as it is imperative that you return to your primary care physician (or establish a relationship with a primary care physician if you do not have one) for your aftercare needs so that they can reassess your need for medications and monitor your lab values.   Increase activity slowly    Complete by:  As directed      Allergies as of 06/30/2016   No Known Allergies     Medication List    You have not been prescribed any medications.       Diet and Activity recommendation: See Discharge Instructions above   Consults obtained -  None   Major procedures and Radiology Reports - PLEASE review detailed and final reports for all details, in brief -     US Renal  Result Date: 06/28/2016 CLINICAL DATA:  21 y/o  M; acute kidney injury. EXAM: RENAL / URINARY TRACT ULTRASOUND COMPLETE COMPARISON:  None. FINDINGS: Right Kidney: Length: 11.5 cm. Echogenicity within normal limits. No mass or hydronephrosis visualized. Left Kidney: Length: 12.0 cm. Echogenicity within normal limits. No mass or hydronephrosis visualized. Bladder: Appears normal for degree of  bladder distention. Bilateral ureteral jets visualized. Other:  1.5 cm echogenic focus in the right lobe of liver. IMPRESSION: 1. Unremarkable appearance of the kidneys.  No hydronephrosis. 2. 1.5 cm echogenic focus in right lobe of liver, this is probably a hemangioma. Consider follow-up with ultrasound to ensure stability. Electronically Signed   By: Mitzi Hansen M.D.   On: 06/28/2016 20:26    Micro Results     No results found for this or any previous visit (from the past 240 hour(s)).     Today   Subjective:   Caleb Morrison today has no headache,no chest abdominal pain,no new weakness tingling or numbness, feels much better wants to go home today.  Objective:   Blood pressure 118/62, pulse (!) 56, temperature 97.8 F (36.6 C), temperature source Oral, resp. rate 17, height 6\' 1"  (1.854 m), weight 115.4 kg (254 lb 6.4 oz), SpO2 100 %.   Intake/Output Summary (Last 24 hours) at 06/30/16 1248 Last data filed at 06/30/16 1214  Gross per 24 hour  Intake             4420 ml  Output             2950 ml  Net             1470 ml    Exam Awake Alert, Oriented x 3, No new F.N deficits, Normal affect Supple Neck,No JVD,  Symmetrical Chest wall movement, Good air movement bilaterally, CTAB RRR,No Gallops,Rubs or new Murmurs, No Parasternal Heave +ve B.Sounds, Abd Soft, Non tender,No rebound -guarding or rigidity. No Cyanosis, Clubbing or edema, No new Rash or bruise  Data Review   CBC w Diff: Lab Results  Component Value Date   WBC 5.7 06/30/2016   HGB 13.6 06/30/2016   HCT 41.6 06/30/2016   PLT 269 06/30/2016   LYMPHOPCT 20 04/25/2016   MONOPCT 12 04/25/2016   EOSPCT 1 04/25/2016   BASOPCT 1 04/25/2016    CMP: Lab Results  Component Value Date   NA 136 06/30/2016   K 3.9 06/30/2016   CL 107 06/30/2016   CO2 24 06/30/2016   BUN 14 06/30/2016   CREATININE 1.22 06/30/2016  .   Total Time in preparing paper work, data evaluation and todays exam - 35  minutes  Caleb Morrison M.D  on 06/30/2016 at 12:48 PM  [facility]   Office  [phone]

## 2016-07-04 NOTE — ED Provider Notes (Signed)
[facility]-EMERGENCY DEPT Provider Note   CSN: [id number] Arrival date & time: 06/28/16  1631     History   Chief Complaint Chief Complaint  Patient presents with  . Heat Exposure    HPI Caleb Morrison is a 21 y.o. male.   Muscle Pain  This is a new problem. The current episode started 6 to 12 hours ago. The problem occurs constantly. The problem has been gradually worsening. Nothing aggravates the symptoms. Nothing relieves the symptoms. He has tried nothing for the symptoms. The treatment provided no relief.  Pt reports he was working outside.  Pt reports he became very hot.  Pt complains of frequent sweating.  Pt complains of cramps in all muscles.  Pt has had a similar episode in the past  History reviewed. No pertinent past medical history.  Patient Active Problem List   Diagnosis Date Noted  . Rhabdomyolysis 06/29/2016  . AKI (acute kidney injury) (HCC) 06/28/2016  . 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 Leukocytosis 06/28/2016  . Metabolic acidosis 06/28/2016    Past Surgical History:  Procedure Laterality Date  . ELBOW SURGERY         Home Medications    Prior to Admission medications   Not on File    Family History History reviewed. No pertinent family history.  Social History Social History  Substance Use Topics  . Smoking status: Never Smoker  . Smokeless tobacco: Never Used  . Alcohol use No     Allergies   Patient has no known allergies.   Review of Systems Review of Systems  All other systems reviewed and are negative.    Physical Exam Updated Vital Signs BP 118/62 (BP Location: Left Arm)   Pulse (!) 56   Temp 97.8 F (36.6 C) (Oral)   Resp 17   Ht 6\' 1"  (1.854 m)   Wt 115.4 kg (254 lb 6.4 oz)   SpO2 100%   BMI 33.56 kg/m   Physical Exam  Constitutional: He appears well-developed and well-nourished.  HENT:  Head: Normocephalic and atraumatic.  Eyes: Conjunctivae are normal.  Neck: Neck supple.    Cardiovascular: Normal rate and regular rhythm.   No murmur heard. Pulmonary/Chest: Effort normal and breath sounds normal. No respiratory distress.  Abdominal: Soft. There is no tenderness.  Musculoskeletal: He exhibits no edema.  Neurological: He is alert.  Skin: Skin is warm and dry.  Psychiatric: He has a normal mood and affect.  Nursing note and vitals reviewed.    ED Treatments / Results  Labs (all labs ordered are listed, but only abnormal results are displayed) Labs Reviewed  CBC - Abnormal; Notable for the following:       Result Value   WBC 14.1 (*)    Hemoglobin 17.8 (*)    All other components within normal limits  BASIC METABOLIC PANEL - Abnormal; Notable for the following:    Chloride 98 (*)    CO2 20 (*)    Glucose, Bld 126 (*)    BUN 24 (*)    Creatinine, Ser 3.18 (*)    Calcium 11.1 (*)    GFR calc non Af Amer 26 (*)    GFR calc Af Amer 30 (*)    Anion gap 19 (*)    All other components within normal limits  URINALYSIS, ROUTINE W REFLEX MICROSCOPIC - Abnormal; Notable for the following:    Hgb urine dipstick MODERATE (*)    Protein, ur 30 (*)    All  other components within normal limits  CBC - Abnormal; Notable for the following:    WBC 12.9 (*)    All other components within normal limits  BASIC METABOLIC PANEL - Abnormal; Notable for the following:    Glucose, Bld 100 (*)    BUN 21 (*)    Creatinine, Ser 1.73 (*)    Calcium 8.4 (*)    GFR calc non Af Amer 54 (*)    All other components within normal limits  CK - Abnormal; Notable for the following:    Total CK 2,055 (*)    All other components within normal limits  BASIC METABOLIC PANEL - Abnormal; Notable for the following:    Glucose, Bld 105 (*)    Calcium 8.4 (*)    All other components within normal limits  CK - Abnormal; Notable for the following:    Total CK 1,925 (*)    All other components within normal limits  SODIUM, URINE, RANDOM  UREA NITROGEN, URINE  CREATININE, URINE,  RANDOM  HIV ANTIBODY (ROUTINE TESTING)  CBC    EKG  EKG Interpretation  Date/Time:  Wednesday Jun 28 2016 16:35:53 EDT Ventricular Rate:  115 PR Interval:    QRS Duration: 101 QT Interval:  319 QTC Calculation: 442 R Axis:   25 Text Interpretation:  Sinus tachycardia Right atrial enlargement Borderline ST elevation, anterior leads Artifact in lead(s) I II III aVR aVL aVF V1 V2 V3 V4 V5 V6 No old tracing to compare Confirmed by Dione Booze ([id number]) on 06/28/2016 4:48:18 PM      Pt given Iv fluids,  Pt has protein in his urine.  He has elevated bun and creatine.  Pt reports decreasing cramping. Radiology No results found.  Procedures Procedures (including critical care time)  Medications Ordered in ED Medications  0.9 %  sodium chloride infusion ( Intravenous New Bag/Given 06/29/16 0452)  sodium chloride 0.9 % bolus 1,000 mL (0 mLs Intravenous Stopped 06/28/16 1754)  sodium chloride 0.9 % bolus 1,000 mL (0 mLs Intravenous Stopped 06/28/16 2045)     Initial Impression / Assessment and Plan / ED Course  I have reviewed the triage vital signs and the nursing notes.  Pertinent labs & imaging results that were available during my care of the patient were reviewed by me and considered in my medical decision making (see chart for details).     I consulted hospitalist for admission for further Iv hydration   Final Clinical Impressions(s) / ED Diagnoses   Final diagnoses:  Heat exhaustion, initial encounter  AKI (acute kidney injury) (HCC)    New Prescriptions There are no discharge medications for this patient.    Elson Areas, PA-C 07/04/16 1303    Elson Areas, PA-C 07/04/16 1304    Dione Booze, MD 07/04/16 2251

## 2016-07-06 ENCOUNTER — Inpatient Hospital Stay (HOSPITAL_COMMUNITY)
Admission: EM | Admit: 2016-07-06 | Discharge: 2016-07-13 | DRG: 683 | Disposition: A | Discharge status: Home / Self Care | Payer: Commercial Managed Care - HMO | Attending: Internal Medicine | Admitting: Internal Medicine

## 2016-07-06 ENCOUNTER — Encounter (HOSPITAL_COMMUNITY): Payer: Self-pay | Admitting: Emergency Medicine

## 2016-07-06 DIAGNOSIS — R739 Hyperglycemia, unspecified: Secondary | ICD-10-CM | POA: Diagnosis present

## 2016-07-06 DIAGNOSIS — N179 Acute kidney failure, unspecified: Principal | ICD-10-CM | POA: Diagnosis present

## 2016-07-06 DIAGNOSIS — E872 Acidosis, unspecified: Secondary | ICD-10-CM | POA: Diagnosis present

## 2016-07-06 DIAGNOSIS — F17210 Nicotine dependence, cigarettes, uncomplicated: Secondary | ICD-10-CM | POA: Diagnosis present

## 2016-07-06 DIAGNOSIS — F1290 Cannabis use, unspecified, uncomplicated: Secondary | ICD-10-CM | POA: Diagnosis present

## 2016-07-06 DIAGNOSIS — M6282 Rhabdomyolysis: Secondary | ICD-10-CM | POA: Diagnosis present

## 2016-07-06 DIAGNOSIS — D72829 Elevated white blood cell count, unspecified: Secondary | ICD-10-CM | POA: Diagnosis present

## 2016-07-06 HISTORY — DX: Rhabdomyolysis: M62.82

## 2016-07-06 LAB — COMPREHENSIVE METABOLIC PANEL
ALBUMIN: 5.3 g/dL — AB (ref 3.5–5.0)
ALT: 40 U/L (ref 17–63)
AST: 51 U/L — AB (ref 15–41)
Alkaline Phosphatase: 60 U/L (ref 38–126)
Anion gap: 22 — ABNORMAL HIGH (ref 5–15)
BUN: 23 mg/dL — AB (ref 6–20)
CHLORIDE: 98 mmol/L — AB (ref 101–111)
CO2: 18 mmol/L — ABNORMAL LOW (ref 22–32)
Calcium: 10.9 mg/dL — ABNORMAL HIGH (ref 8.9–10.3)
Creatinine, Ser: 3.67 mg/dL — ABNORMAL HIGH (ref 0.61–1.24)
GFR calc Af Amer: 25 mL/min — ABNORMAL LOW (ref >60)
GFR, EST NON AFRICAN AMERICAN: 22 mL/min — AB (ref >60)
GLUCOSE: 114 mg/dL — AB (ref 65–99)
POTASSIUM: 4.1 mmol/L (ref 3.5–5.1)
Sodium: 138 mmol/L (ref 135–145)
Total Bilirubin: 1 mg/dL (ref 0.3–1.2)
Total Protein: 8.9 g/dL — ABNORMAL HIGH (ref 6.5–8.1)

## 2016-07-06 LAB — CBC WITH DIFFERENTIAL/PLATELET
BASOS PCT: 0 %
Basophils Absolute: 0 10*3/uL (ref 0.0–0.1)
EOS ABS: 0 10*3/uL (ref 0.0–0.7)
Eosinophils Relative: 0 %
HCT: 49.8 % (ref 39.0–52.0)
Hemoglobin: 16.6 g/dL (ref 13.0–17.0)
Lymphocytes Relative: 16 %
Lymphs Abs: 2.3 10*3/uL (ref 0.7–4.0)
MCH: 31.1 pg (ref 26.0–34.0)
MCHC: 33.3 g/dL (ref 30.0–36.0)
MCV: 93.3 fL (ref 78.0–100.0)
Monocytes Absolute: 1 10*3/uL (ref 0.1–1.0)
Monocytes Relative: 7 %
NEUTROS ABS: 11.4 10*3/uL — AB (ref 1.7–7.7)
NEUTROS PCT: 77 %
PLATELETS: 325 10*3/uL (ref 150–400)
RBC: 5.34 MIL/uL (ref 4.22–5.81)
RDW: 12.7 % (ref 11.5–15.5)
WBC: 14.8 10*3/uL — AB (ref 4.0–10.5)

## 2016-07-06 LAB — URINALYSIS, ROUTINE W REFLEX MICROSCOPIC
Bilirubin Urine: NEGATIVE
Glucose, UA: NEGATIVE mg/dL
Ketones, ur: NEGATIVE mg/dL
Leukocytes, UA: NEGATIVE
Nitrite: NEGATIVE
PROTEIN: 100 mg/dL — AB
Specific Gravity, Urine: 1.019 (ref 1.005–1.030)
pH: 5 (ref 5.0–8.0)

## 2016-07-06 LAB — CK: CK TOTAL: 929 U/L — AB (ref 49–397)

## 2016-07-06 MED ORDER — SODIUM CHLORIDE 0.9 % IV BOLUS (SEPSIS)
1000.0000 mL | Freq: Once | INTRAVENOUS | Status: AC
  Administered 2016-07-06: 1000 mL via INTRAVENOUS

## 2016-07-06 MED ORDER — LACTATED RINGERS IV SOLN
INTRAVENOUS | Status: DC
  Administered 2016-07-06: 250 mL/h via INTRAVENOUS
  Administered 2016-07-07 – 2016-07-10 (×17): via INTRAVENOUS

## 2016-07-06 MED ORDER — LACTATED RINGERS IV BOLUS (SEPSIS)
1000.0000 mL | Freq: Once | INTRAVENOUS | Status: AC
  Administered 2016-07-06: 1000 mL via INTRAVENOUS

## 2016-07-06 NOTE — ED Notes (Signed)
hospitalist at the bedside 

## 2016-07-06 NOTE — ED Triage Notes (Signed)
Pt brought to ED by [facility] after having multiple cramps all over his right side, very diaphoretic an anxious on EMS arrival, pt starting with the cramps while driving, was working on moaning the yard all day long with poor hydration, see last week for Rhabdo. 1200 mL NS given by EMS prior to arrival to ED. Initial HR 150, after fluids 108, BP 100/60.

## 2016-07-06 NOTE — H&P (Signed)
History and Physical    Caleb Morrison ZOX:[id number] DOB: [birth year] DOA: 07/06/2016  PCP: None  Consultants:  None Patient coming from: Home - lives with parents; NOK: mother, [phone]  Chief Complaint:  Heat injury  HPI: Caleb Morrison is a 21 y.o. male with medical history significant of hospitalization from 5/23-25/18 for AKI and heat exhaustion presenting with the same.  He reports "I lost too much water."  Started feeling bad about 230 today.  3-4 weeks since last alcohol. He just went back to work yesterday.  He felt fine then and this AM but started feeling bad again.  No UOP for several hours.  Diffuse muscle cramping noted.   ED Course: CK 900, Creatinine 3.67, 1L bolus and LR at 250 cc/hour  Review of Systems: As per HPI; otherwise review of systems reviewed and negative.   Ambulatory Status:  Ambulates without assistance  Past Medical History:  Diagnosis Date  . Rhabdomyolysis     Past Surgical History:  Procedure Laterality Date  . ELBOW SURGERY      Social History   Social History  . Marital status: Single    Spouse name: N/A  . Number of children: N/A  . Years of education: N/A   Occupational History  . Landscaper    Social History Main Topics  . Smoking status: Current Some Day Smoker  . Smokeless tobacco: Never Used  . Alcohol use No  . Drug use: Yes    Types: Marijuana     Comment: last use 4 days ago  . Sexual activity: Not on file   Other Topics Concern  . Not on file   Social History Narrative  . No narrative on file    No Known Allergies  History reviewed. No pertinent family history.  Prior to Admission medications   Not on File    Physical Exam: Vitals:   07/06/16 2145 07/06/16 2200 07/06/16 2214 07/06/16 2315  BP: 133/73 113/70  132/64  Pulse: 81 85  88  Resp: 19 (!) 21  (!) 25  Temp:   97.6 F (36.4 C)   TempSrc:   Oral   SpO2: 96% 95%  92%  Weight:      Height:         General:  Appears calm and comfortable and  is NAD; eating with his mouth full throughout the evaluation Eyes:  PERRL, EOMI, normal lids, iris ENT:  grossly normal hearing, lips & tongue, mmm Neck:  no LAD, masses or thyromegaly Cardiovascular:  RRR, no m/r/g. No LE edema.  Respiratory:  CTA bilaterally, no w/r/r. Normal respiratory effort. Abdomen:  soft, ntnd, NABS Skin:  no rash or induration seen on limited exam Musculoskeletal:  grossly normal tone BUE/BLE, good ROM, no bony abnormality Psychiatric:  grossly normal mood and affect, speech fluent and appropriate, AOx3 Neurologic:  CN 2-12 grossly intact, moves all extremities in coordinated fashion, sensation intact  Labs on Admission: I have personally reviewed following labs and imaging studies  CBC:  Recent Labs Lab 06/30/16 0533 07/06/16 2018  WBC 5.7 14.8*  NEUTROABS  --  11.4*  HGB 13.6 16.6  HCT 41.6 49.8  MCV 92.4 93.3  PLT 269 325   Basic Metabolic Panel:  Recent Labs Lab 06/30/16 0533 07/06/16 2025  NA 136 138  K 3.9 4.1  CL 107 98*  CO2 24 18*  GLUCOSE 105* 114*  BUN 14 23*  CREATININE 1.22 3.67*  CALCIUM 8.4* 10.9*   GFR: Estimated Creatinine Clearance: 42  mL/min (A) (by C-G formula based on SCr of 3.67 mg/dL (H)). Liver Function Tests:  Recent Labs Lab 07/06/16 2025  AST 51*  ALT 40  ALKPHOS 60  BILITOT 1.0  PROT 8.9*  ALBUMIN 5.3*   No results for input(s): LIPASE, AMYLASE in the last 168 hours. No results for input(s): AMMONIA in the last 168 hours. Coagulation Profile: No results for input(s): INR, PROTIME in the last 168 hours. Cardiac Enzymes:  Recent Labs Lab 06/30/16 0533 07/06/16 2025  CKTOTAL 1,925* 929*   BNP (last 3 results) No results for input(s): PROBNP in the last 8760 hours. HbA1C: No results for input(s): HGBA1C in the last 72 hours. CBG: No results for input(s): GLUCAP in the last 168 hours. Lipid Profile: No results for input(s): CHOL, HDL, LDLCALC, TRIG, CHOLHDL, LDLDIRECT in the last 72  hours. Thyroid Function Tests: No results for input(s): TSH, T4TOTAL, FREET4, T3FREE, THYROIDAB in the last 72 hours. Anemia Panel: No results for input(s): VITAMINB12, FOLATE, FERRITIN, TIBC, IRON, RETICCTPCT in the last 72 hours. Urine analysis:    Component Value Date/Time   COLORURINE YELLOW 07/06/2016 2141   APPEARANCEUR CLOUDY (A) 07/06/2016 2141   LABSPEC 1.019 07/06/2016 2141   PHURINE 5.0 07/06/2016 2141   GLUCOSEU NEGATIVE 07/06/2016 2141   HGBUR MODERATE (A) 07/06/2016 2141   BILIRUBINUR NEGATIVE 07/06/2016 2141   KETONESUR NEGATIVE 07/06/2016 2141   PROTEINUR 100 (A) 07/06/2016 2141   UROBILINOGEN 0.2 11/24/2015 1200   NITRITE NEGATIVE 07/06/2016 2141   LEUKOCYTESUR NEGATIVE 07/06/2016 2141    Creatinine Clearance: Estimated Creatinine Clearance: 42 mL/min (A) (by C-G formula based on SCr of 3.67 mg/dL (H)).  Sepsis Labs: @LABRCNTIP (procalcitonin:4,lacticidven:4) )No results found for this or any previous visit (from the past 240 hour(s)).   Radiological Exams on Admission: No results found.  EKG: Independently reviewed.  NSR with rate 95; early repolarization with no evidence of acute ischemia  Assessment/Plan Principal Problem:   Acute renal failure (ARF) (HCC) Active Problems:   Leukocytosis   Metabolic acidosis   Rhabdomyolysis   Hyperglycemia   Pertinent labs: Glucose 114 BUN 23/Creatinine 3.67/GFR 22; prior 14/1.22/>60 on 5/25 CK 929 WBC 14.8 UA: rare bacteria, moderate Hgb, +hyaline casts, + mucous, 100 RBC  Acute renal failure with mild rhabdomyolysis -Patient with hospitalization last week for exactly the same problem, returned to work in the heat yesterday and returns again with severe dehydration, ARF, and mild rhabdo -Observe with aggressive IVF at 150-250 cc/hour -Hepatic function panel, CK, and BMP with Mag/Phos q12h -Myoglobin (blood), CBC qAM -Lovenox for DVT prophylaxis -Push PO fluids if able -Strict I/Os -Vital signs q4h -Based  on rapid recurrence, patient either needs to stop outside work and seek inside employment or he needs to be discharged on a "dead man's profile" with very slow return to activity and close laboratory monitoring to ensure that this does not continue to happen -He also needs to stop smoking cigarettes and marijuana -Will check UDS -He had a negative renal US last week so will not repeat - other than 1.5 cm echogenic focus in the right lobe of the liver (likely benign, but needs eventual follow-up ultrasound)  Leukocytosis - Likely reactive and/or secondary to hemoconcentration  Hypergylcemia -May be stress response -Will follow with fasting AM labs and check A1c   DVT prophylaxis:  Lovenox Code Status: Full  Family Communication: Many family members present throughout evaluation Disposition Plan:  Home once clinically improved Consults called: None  Admission status: It is my clinical  opinion that referral for OBSERVATION is reasonable and necessary in this patient based on the above information provided. The aforementioned taken together are felt to place the patient at high risk for further clinical deterioration. However it is anticipated that the patient may be medically stable for discharge from the hospital within 24 to 48 hours.    Jonah Blue MD [facility]  If 7PM-7AM, please contact night-coverage [email or web address] Password [email or web address]  07/07/2016, 12:03 AM

## 2016-07-06 NOTE — ED Provider Notes (Signed)
[facility]-EMERGENCY DEPT Provider Note   CSN: [id number] Arrival date & time: 07/06/16  2011     History   Chief Complaint Chief Complaint  Patient presents with  . Heat Exposure    Rabdo    HPI Caleb Morrison is a 21 y.o. male.  The history is provided by the patient.  Illness  This is a new problem. The current episode started 3 to 5 hours ago. The problem occurs constantly. The problem has not changed since onset.Pertinent negatives include no chest pain, no abdominal pain, no headaches and no shortness of breath. Associated symptoms comments: Diffuse body aches. Patient diagnosed with rhambdomyolsis last week and admitted for fluids. Patient went back to work yesterday and today developed cramps throughout body as he works as a Administrator outside in the heat. Feels similar to last week. Denies drug use, ETOH. The symptoms are aggravated by exertion. Nothing relieves the symptoms. He has tried nothing for the symptoms.    Past Medical History:  Diagnosis Date  . Rhabdomyolysis     Patient Active Problem List   Diagnosis Date Noted  . Hyperglycemia 07/07/2016  . Acute renal failure (ARF) (HCC) 07/06/2016  . Rhabdomyolysis 06/29/2016  . 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 Leukocytosis 06/28/2016  . Metabolic acidosis 06/28/2016    Past Surgical History:  Procedure Laterality Date  . ELBOW SURGERY         Home Medications    Prior to Admission medications   Not on File    Family History History reviewed. No pertinent family history.  Social History Social History  Substance Use Topics  . Smoking status: Current Some Day Smoker  . Smokeless tobacco: Never Used  . Alcohol use No     Allergies   Patient has no known allergies.   Review of Systems Review of Systems  Constitutional: Negative for chills and fever.  HENT: Negative for ear pain and sore throat.   Eyes: Negative for pain and visual disturbance.  Respiratory: Negative for  cough and shortness of breath.   Cardiovascular: Negative for chest pain and palpitations.  Gastrointestinal: Negative for abdominal pain and vomiting.  Genitourinary: Negative for dysuria and hematuria.  Musculoskeletal: Positive for myalgias. Negative for arthralgias and back pain.  Skin: Negative for color change and rash.  Neurological: Negative for seizures, syncope and headaches.  All other systems reviewed and are negative.    Physical Exam Updated Vital Signs  ED Triage Vitals  Enc Vitals Group     BP 07/06/16 2030 120/77     Pulse Rate 07/06/16 2030 98     Resp 07/06/16 2030 (!) 38     Temp --      Temp src --      SpO2 07/06/16 2014 100 %     Weight 07/06/16 2016 254 lb (115.2 kg)     Height 07/06/16 2016 6\' 1"  (1.854 m)     Head Circumference --      Peak Flow --      Pain Score 07/06/16 2016 0     Pain Loc --      Pain Edu? --      Excl. in GC? --     Physical Exam  Constitutional: He is oriented to person, place, and time. He appears well-nourished.  HENT:  Head: Normocephalic and atraumatic.  Eyes: Conjunctivae and EOM are normal. Pupils are equal, round, and reactive to light.  Neck: Normal range of motion. Neck supple.  Cardiovascular:  Normal rate, regular rhythm, normal heart sounds and intact distal pulses.   No murmur heard. Pulmonary/Chest: Effort normal and breath sounds normal. No respiratory distress. He has no wheezes. He has no rales.  Abdominal: Soft. Bowel sounds are normal. He exhibits no distension. There is no tenderness.  Musculoskeletal: Normal range of motion. He exhibits tenderness. He exhibits no edema.  Neurological: He is alert and oriented to person, place, and time.  Skin: Skin is warm and dry. Capillary refill takes less than 2 seconds.  Psychiatric: He has a normal mood and affect.  Nursing note and vitals reviewed.    ED Treatments / Results  Labs (all labs ordered are listed, but only abnormal results are displayed) Labs  Reviewed  CBC WITH DIFFERENTIAL/PLATELET - Abnormal; Notable for the following:       Result Value   WBC 14.8 (*)    Neutro Abs 11.4 (*)    All other components within normal limits  URINALYSIS, ROUTINE W REFLEX MICROSCOPIC - Abnormal; Notable for the following:    APPearance CLOUDY (*)    Hgb urine dipstick MODERATE (*)    Protein, ur 100 (*)    Bacteria, UA RARE (*)    Squamous Epithelial / LPF 0-5 (*)    All other components within normal limits  CK - Abnormal; Notable for the following:    Total CK 929 (*)    All other components within normal limits  COMPREHENSIVE METABOLIC PANEL - Abnormal; Notable for the following:    Chloride 98 (*)    CO2 18 (*)    Glucose, Bld 114 (*)    BUN 23 (*)    Creatinine, Ser 3.67 (*)    Calcium 10.9 (*)    Total Protein 8.9 (*)    Albumin 5.3 (*)    AST 51 (*)    GFR calc non Af Amer 22 (*)    GFR calc Af Amer 25 (*)    Anion gap 22 (*)    All other components within normal limits    EKG  EKG Interpretation  Date/Time:  Thursday Jul 06 2016 20:16:15 EDT Ventricular Rate:  95 PR Interval:    QRS Duration: 103 QT Interval:  353 QTC Calculation: 444 R Axis:   10 Text Interpretation:  Sinus rhythm ST elev, probable normal early repol pattern similar to prior EKG Confirmed by Crista CurbLiu, Dana [phone]([id number]) on 07/06/2016 9:32:40 PM       Radiology No results found.  Procedures Procedures (including critical care time)  Medications Ordered in ED Medications  lactated ringers bolus 1,000 mL (1,000 mLs Intravenous New Bag/Given 07/06/16 2218)  lactated ringers infusion (250 mL/hr Intravenous New Bag/Given 07/06/16 2334)  sodium chloride 0.9 % bolus 1,000 mL (0 mLs Intravenous Stopped 07/06/16 2137)     Initial Impression / Assessment and Plan / ED Course  I have reviewed the triage vital signs and the nursing notes.  Pertinent labs & imaging results that were available during my care of the patient were reviewed by me and considered in my  medical decision making (see chart for details).     Caleb Morrison is a 21 year old male with no significant medical history who presents to the ED with concern for rhabdomyolysis. Patient vitals at time of arrival to the ED are unremarkable patient is without fever. Patient recently discharged from the hospital several days ago after he was treated for rhabdo. 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 Patient states he started to have cramps throughout his body today at work and is concerned for dehydration. Patient had EKG performed that showed normal sinus rhythm with no signs of ischemia. Exam is overall unremarkable. Patient does appear to have some discomfort. He denies any alcohol, drug use, supplement use. Concern for rhabdomyolysis and CBC, CMP, CK, urinalysis was ordered. Patient given normal saline bolus.  Patient with elevation of CK and significant kidney injury with creatinine of 3.67. Mild elevation in liver enzymes. Patient with mild leukocytosis likely from stress reaction. Urinalysis also consistent with myoglobin breakdown. Patient given additional lactated ringer bolus and started on lactated ringer infusion. Patient admitted to medicine service for further care. Patient was hemodynamically stable at time of transfer.   Final Clinical Impressions(s) / ED Diagnoses   Final diagnoses:  AKI (acute kidney injury) [facility])  Non-traumatic rhabdomyolysis    New Prescriptions New Prescriptions   No medications on file     Virgina Norfolk, DO 07/07/16 0012    Lavera Guise, MD 07/07/16 0020

## 2016-07-06 NOTE — ED Provider Notes (Signed)
Medical screening examination/treatment/procedure(s) were conducted as a shared visit with non-physician practitioner(s) and myself.  I personally evaluated the patient during the encounter.   EKG Interpretation  Date/Time:  Thursday Jul 06 2016 20:16:15 EDT Ventricular Rate:  95 PR Interval:    QRS Duration: 103 QT Interval:  353 QTC Calculation: 444 R Axis:   10 Text Interpretation:  Sinus rhythm ST elev, probable normal early repol pattern similar to prior EKG Confirmed by Crista CurbLiu, Kehlani Vancamp [phone]) on 07/06/2016 9:32:8340 PM      21 year old male who presents with muscle cramping in concern for rhabdomyolysis. He is otherwise healthy. 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 He just resumed his job yesterday, states that he has been working full days in the heat doing landscaping. Has had a little rest, and has been drinking fluids. All day today has had mild muscle cramping diffusely. No vomiting, fevers or chills. Has had diminished nourished and darkened urine today.  On arrival, he is nontoxic in no acute distress. Exam overall non-focal. Vitals are stable. Mild elevation of CK of 900. With significant acute kidney injury and a creatinine of 3.67. With metabolic acidosis likely related to dehydration. Given IV fluids, plan to admit for observation for ongoing rehydration.   Lavera GuiseLiu, Lorrine Killilea Duo, MD 07/06/16 [phone]

## 2016-07-07 DIAGNOSIS — E872 Acidosis: Secondary | ICD-10-CM | POA: Diagnosis not present

## 2016-07-07 DIAGNOSIS — R739 Hyperglycemia, unspecified: Secondary | ICD-10-CM | POA: Diagnosis not present

## 2016-07-07 DIAGNOSIS — N179 Acute kidney failure, unspecified: Secondary | ICD-10-CM | POA: Diagnosis not present

## 2016-07-07 DIAGNOSIS — M6282 Rhabdomyolysis: Secondary | ICD-10-CM | POA: Diagnosis not present

## 2016-07-07 DIAGNOSIS — D72829 Elevated white blood cell count, unspecified: Secondary | ICD-10-CM | POA: Diagnosis not present

## 2016-07-07 LAB — COMPREHENSIVE METABOLIC PANEL
ALBUMIN: 3.2 g/dL — AB (ref 3.5–5.0)
ALK PHOS: 43 U/L (ref 38–126)
ALT: 27 U/L (ref 17–63)
ALT: 28 U/L (ref 17–63)
ANION GAP: 6 (ref 5–15)
AST: 39 U/L (ref 15–41)
AST: 63 U/L — AB (ref 15–41)
Albumin: 3.8 g/dL (ref 3.5–5.0)
Alkaline Phosphatase: 53 U/L (ref 38–126)
Anion gap: 7 (ref 5–15)
BILIRUBIN TOTAL: 0.6 mg/dL (ref 0.3–1.2)
BUN: 20 mg/dL (ref 6–20)
BUN: 13 mg/dL (ref 6–20)
CALCIUM: 8.6 mg/dL — AB (ref 8.9–10.3)
CHLORIDE: 103 mmol/L (ref 101–111)
CO2: 26 mmol/L (ref 22–32)
CO2: 26 mmol/L (ref 22–32)
CREATININE: 2.05 mg/dL — AB (ref 0.61–1.24)
CREATININE: 1.35 mg/dL — AB (ref 0.61–1.24)
Calcium: 8.9 mg/dL (ref 8.9–10.3)
Chloride: 106 mmol/L (ref 101–111)
GFR calc Af Amer: >60 mL/min (ref >60)
GFR calc non Af Amer: 44 mL/min — ABNORMAL LOW (ref >60)
GFR calc non Af Amer: >60 mL/min (ref >60)
GFR, EST AFRICAN AMERICAN: 51 mL/min — AB (ref >60)
GLUCOSE: 89 mg/dL (ref 65–99)
Glucose, Bld: 127 mg/dL — ABNORMAL HIGH (ref 65–99)
POTASSIUM: 3.8 mmol/L (ref 3.5–5.1)
Potassium: 3.9 mmol/L (ref 3.5–5.1)
SODIUM: 136 mmol/L (ref 135–145)
Sodium: 138 mmol/L (ref 135–145)
Total Bilirubin: 0.7 mg/dL (ref 0.3–1.2)
Total Protein: 6.5 g/dL (ref 6.5–8.1)
Total Protein: 5.8 g/dL — ABNORMAL LOW (ref 6.5–8.1)

## 2016-07-07 LAB — CK
Total CK: 1759 U/L — ABNORMAL HIGH (ref 49–397)
Total CK: 4051 U/L — ABNORMAL HIGH (ref 49–397)

## 2016-07-07 LAB — RAPID URINE DRUG SCREEN, HOSP PERFORMED
Amphetamines: NOT DETECTED
BARBITURATES: NOT DETECTED
Benzodiazepines: NOT DETECTED
Cocaine: NOT DETECTED
Opiates: NOT DETECTED
TETRAHYDROCANNABINOL: POSITIVE — AB

## 2016-07-07 LAB — CBC
HEMATOCRIT: 42 % (ref 39.0–52.0)
HEMOGLOBIN: 14.4 g/dL (ref 13.0–17.0)
MCH: 31.4 pg (ref 26.0–34.0)
MCHC: 34.3 g/dL (ref 30.0–36.0)
MCV: 91.7 fL (ref 78.0–100.0)
Platelets: 284 10*3/uL (ref 150–400)
RBC: 4.58 MIL/uL (ref 4.22–5.81)
RDW: 12.7 % (ref 11.5–15.5)
WBC: 14.2 10*3/uL — ABNORMAL HIGH (ref 4.0–10.5)

## 2016-07-07 LAB — MAGNESIUM: Magnesium: 2.2 mg/dL (ref 1.7–2.4)

## 2016-07-07 LAB — PHOSPHORUS: Phosphorus: 4.4 mg/dL (ref 2.5–4.6)

## 2016-07-07 MED ORDER — ACETAMINOPHEN 325 MG PO TABS
650.0000 mg | ORAL_TABLET | Freq: Four times a day (QID) | ORAL | Status: DC | PRN
  Administered 2016-07-07 – 2016-07-08 (×2): 650 mg via ORAL
  Filled 2016-07-07 (×2): qty 2

## 2016-07-07 MED ORDER — ONDANSETRON HCL 4 MG PO TABS: 4.0000 mg | ORAL_TABLET | Freq: Four times a day (QID) | ORAL | Status: DC | PRN

## 2016-07-07 MED ORDER — DOCUSATE SODIUM 100 MG PO CAPS
100.0000 mg | ORAL_CAPSULE | Freq: Two times a day (BID) | ORAL | Status: DC
  Administered 2016-07-07 – 2016-07-13 (×12): 100 mg via ORAL
  Filled 2016-07-07 (×13): qty 1

## 2016-07-07 MED ORDER — ENOXAPARIN SODIUM 30 MG/0.3ML ~~LOC~~ SOLN
30.0000 mg | SUBCUTANEOUS | Status: DC
Start: 2016-07-07 — End: 2016-07-08
  Administered 2016-07-08: 30 mg via SUBCUTANEOUS
  Filled 2016-07-07 (×2): qty 0.3

## 2016-07-07 MED ORDER — ONDANSETRON HCL 4 MG/2ML IJ SOLN: 4.0000 mg | Freq: Four times a day (QID) | INTRAMUSCULAR | Status: DC | PRN

## 2016-07-07 MED ORDER — ACETAMINOPHEN 650 MG RE SUPP: 650.0000 mg | Freq: Four times a day (QID) | RECTAL | Status: DC | PRN

## 2016-07-07 NOTE — Progress Notes (Signed)
New Admission Note:  Arrival Method: Stretcher from Ed.  Mental Orientation: Alert & Oriented x4 Telemetry: N/A Assessment: Completed Skin: Refer to flowhseet IV: Left AC. Pain: 4/10 Tubes: None Safety Measures: Safety Fall Prevention Plan discussed with patient. Admission: Completed [address] Orientation: Patient has been orientated to the room, unit and the staff. Family: Friend at bedside.  Orders have been reviewed and implemented. Will continue to monitor the patient. Call light has been placed within reach.  Aram CandelaJequetta Javanna Patin, RN  Phone Number: [phone]

## 2016-07-07 NOTE — Progress Notes (Signed)
PROGRESS NOTE    Caleb ClayJustin Fanguy  MVH:[id number] DOB: 24-Jan-1996 DOA: 07/06/2016 PCP: System, Pcp Not In   Brief Narrative: Caleb Morrison is a 21 y.o. with a medical history significant of recent rhabdomyolysis.   Assessment & Plan:   Principal Problem:   Acute renal failure (ARF) (HCC) Active Problems:   Leukocytosis   Metabolic acidosis   Rhabdomyolysis   Hyperglycemia   Acute kidney injury Secondary to rhabdomyolysis. Improving. 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 Hyperglycemia without ketones in urine.  Hyperglycemia -hemoglobin A1c pending  Leukocytosis No evidence of infection. Likely stress reaction    DVT prophylaxis: Lovenox Code Status: Full code Family Communication: Girl friend at bedside Disposition Plan: Discharge in 24-48 hours pending resolution/improvement of rhabdomyolysis   Consultants:   None  Procedures:   None  Antimicrobials:  None    Subjective: Patient reports some cramping in his legs bilaterally  Objective: Vitals:   07/07/16 0030 07/07/16 0045 07/07/16 0129 07/07/16 0526  BP: 108/60 (!) 116/53 (!) 147/67 134/72  Pulse: 92 82 72 75  Resp: (!) 22 18 17 16   Temp:   97.6 F (36.4 C) 97.8 F (36.6 C)  TempSrc:      SpO2: 94% 95% 98% 94%  Weight:   109.8 kg (242 lb)   Height:   6\' 1"  (1.854 m)     Intake/Output Summary (Last 24 hours) at 07/07/16 1016 Last data filed at 07/07/16 0601  Gross per 24 hour  Intake           2612.5 ml  Output              900 ml  Net           1712.5 ml   Filed Weights   07/06/16 2016 07/07/16 0129  Weight: 115.2 kg (254 lb) 109.8 kg (242 lb)    Examination:  General exam: Appears calm and comfortable Respiratory system: Clear to auscultation. Respiratory effort normal. Cardiovascular system: S1  & S2 heard, RRR. No murmurs Gastrointestinal system: Abdomen is nondistended, soft and nontender.Normal bowel sounds heard. Central nervous system: Alert and oriented. No focal neurological deficits. Extremities: No edema. No calf tenderness Skin: No cyanosis. No rashes Psychiatry: Judgement and insight appear normal. Mood & affect appropriate.     Data Reviewed: I have personally reviewed following labs and imaging studies  CBC:  Recent Labs Lab 07/06/16 2018 07/07/16 0210  WBC 14.8* 14.2*  NEUTROABS 11.4*  --   HGB 16.6 14.4  HCT 49.8 42.0  MCV 93.3 91.7  PLT 325 284   Basic Metabolic Panel:  Recent Labs Lab 07/06/16 2025 07/07/16 0210  NA 138 136  K 4.1 3.8  CL 98* 103  CO2 18* 26  GLUCOSE 114* 127*  BUN 23* 20  CREATININE 3.67* 2.05*  CALCIUM 10.9* 8.9  MG  --  2.2  PHOS  --  4.4   GFR: Estimated Creatinine Clearance: 73.5 mL/min (A) (by C-G formula based on SCr of 2.05 mg/dL (H)). Liver Function Tests:  Recent Labs Lab 07/06/16 2025 07/07/16 0210  AST 51* 39  ALT 40 27  ALKPHOS 60 53  BILITOT 1.0 0.7  PROT 8.9* 6.5  ALBUMIN 5.3* 3.8   No results for input(s): LIPASE, AMYLASE in the last 168 hours. No results for input(s): AMMONIA in the last 168 hours. Coagulation Profile:  No results for input(s): INR, PROTIME in the last 168 hours. Cardiac Enzymes:  Recent Labs Lab 07/06/16 2025 07/07/16 0210  CKTOTAL 929* 1,759*   BNP (last 3 results) No results for input(s): PROBNP in the last 8760 hours. HbA1C: No results for input(s): HGBA1C in the last 72 hours. CBG: No results for input(s): GLUCAP in the last 168 hours. Lipid Profile: No results for input(s): CHOL, HDL, LDLCALC, TRIG, CHOLHDL, LDLDIRECT in the last 72 hours. Thyroid Function Tests: No results for input(s): TSH, T4TOTAL, FREET4, T3FREE, THYROIDAB in the last 72 hours. Anemia Panel: No results for input(s): VITAMINB12, FOLATE, FERRITIN, TIBC, IRON, RETICCTPCT in the last 72  hours. Sepsis Labs: No results for input(s): PROCALCITON, LATICACIDVEN in the last 168 hours.  No results found for this or any previous visit (from the past 240 hour(s)).       Radiology Studies: No results found.      Scheduled Meds: . docusate sodium  100 mg Oral BID  . enoxaparin (LOVENOX) injection  30 mg Subcutaneous Q24H   Continuous Infusions: . lactated ringers 250 mL/hr at 07/07/16 0840     LOS: 0 days     Jacquelin Hawking, MD [facility] 07/07/2016, 10:16 AM Pager: [phone]  If 7PM-7AM, please contact night-coverage [email or web address] Password [email or web address] 07/07/2016, 10:16 AM

## 2016-07-08 DIAGNOSIS — N179 Acute kidney failure, unspecified: Secondary | ICD-10-CM | POA: Diagnosis not present

## 2016-07-08 DIAGNOSIS — F17210 Nicotine dependence, cigarettes, uncomplicated: Secondary | ICD-10-CM | POA: Diagnosis present

## 2016-07-08 DIAGNOSIS — M6282 Rhabdomyolysis: Secondary | ICD-10-CM | POA: Diagnosis not present

## 2016-07-08 DIAGNOSIS — R030 Elevated blood-pressure reading, without diagnosis of hypertension: Secondary | ICD-10-CM | POA: Diagnosis not present

## 2016-07-08 DIAGNOSIS — R739 Hyperglycemia, unspecified: Secondary | ICD-10-CM | POA: Diagnosis not present

## 2016-07-08 DIAGNOSIS — R740 Nonspecific elevation of levels of transaminase and lactic acid dehydrogenase [LDH]: Secondary | ICD-10-CM | POA: Diagnosis not present

## 2016-07-08 DIAGNOSIS — E872 Acidosis: Secondary | ICD-10-CM | POA: Diagnosis not present

## 2016-07-08 DIAGNOSIS — F1290 Cannabis use, unspecified, uncomplicated: Secondary | ICD-10-CM | POA: Diagnosis present

## 2016-07-08 LAB — HEMOGLOBIN A1C
HEMOGLOBIN A1C: 4.8 % (ref 4.8–5.6)
MEAN PLASMA GLUCOSE: 91 mg/dL

## 2016-07-08 LAB — COMPREHENSIVE METABOLIC PANEL
ALT: 26 U/L (ref 17–63)
ALT: 33 U/L (ref 17–63)
AST: 62 U/L — ABNORMAL HIGH (ref 15–41)
AST: 81 U/L — AB (ref 15–41)
Albumin: 2.9 g/dL — ABNORMAL LOW (ref 3.5–5.0)
Albumin: 3.3 g/dL — ABNORMAL LOW (ref 3.5–5.0)
Alkaline Phosphatase: 43 U/L (ref 38–126)
Alkaline Phosphatase: 49 U/L (ref 38–126)
Anion gap: 5 (ref 5–15)
Anion gap: 6 (ref 5–15)
BUN: 11 mg/dL (ref 6–20)
BUN: 8 mg/dL (ref 6–20)
CHLORIDE: 107 mmol/L (ref 101–111)
CHLORIDE: 105 mmol/L (ref 101–111)
CO2: 25 mmol/L (ref 22–32)
CO2: 27 mmol/L (ref 22–32)
CREATININE: 1.25 mg/dL — AB (ref 0.61–1.24)
Calcium: 8.3 mg/dL — ABNORMAL LOW (ref 8.9–10.3)
Calcium: 8.9 mg/dL (ref 8.9–10.3)
Creatinine, Ser: 1.34 mg/dL — ABNORMAL HIGH (ref 0.61–1.24)
GFR calc Af Amer: >60 mL/min (ref >60)
GFR calc non Af Amer: >60 mL/min (ref >60)
Glucose, Bld: 103 mg/dL — ABNORMAL HIGH (ref 65–99)
Glucose, Bld: 90 mg/dL (ref 65–99)
POTASSIUM: 4 mmol/L (ref 3.5–5.1)
Potassium: 3.9 mmol/L (ref 3.5–5.1)
SODIUM: 138 mmol/L (ref 135–145)
Sodium: 137 mmol/L (ref 135–145)
Total Bilirubin: 0.6 mg/dL (ref 0.3–1.2)
Total Bilirubin: 0.7 mg/dL (ref 0.3–1.2)
Total Protein: 5.2 g/dL — ABNORMAL LOW (ref 6.5–8.1)
Total Protein: 6.3 g/dL — ABNORMAL LOW (ref 6.5–8.1)

## 2016-07-08 LAB — MYOGLOBIN, SERUM: Myoglobin: 1146 ng/mL — ABNORMAL HIGH (ref 28–72)

## 2016-07-08 LAB — CK
CK TOTAL: 3835 U/L — AB (ref 49–397)
Total CK: 5154 U/L — ABNORMAL HIGH (ref 49–397)

## 2016-07-08 MED ORDER — ENOXAPARIN SODIUM 40 MG/0.4ML ~~LOC~~ SOLN
40.0000 mg | SUBCUTANEOUS | Status: DC
  Administered 2016-07-09 – 2016-07-12 (×4): 40 mg via SUBCUTANEOUS
  Filled 2016-07-08 (×5): qty 0.4

## 2016-07-08 NOTE — Progress Notes (Addendum)
PROGRESS NOTE    Caleb Morrison  [id number] DOB: 1996-01-16 DOA: 07/06/2016 PCP: System, Pcp Not In   Brief Narrative: Caleb Morrison is a 21 y.o. with a medical history significant of recent rhabdomyolysis. He presented with rhabdomyolysis and acute kidney injury secondary to exertion and dehydration.   Assessment & Plan:   Principal Problem:   Acute renal failure (ARF) (HCC) Active Problems:   Leukocytosis   Metabolic acidosis   Rhabdomyolysis   Hyperglycemia   Acute kidney injury Secondary to rhabdomyolysis. Creatinine stable from 12 hours ago. 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 CK has reached a plateau  -continue aggressive hydration -follow-up CK  High anion gap metabolic acidosis Resolved with IV fluids. Hyperglycemia without ketones in urine.  Hyperglycemia Hemoglobin A1c of 4.8  Leukocytosis No evidence of infection. Likely stress reaction    DVT prophylaxis: Lovenox Code Status: Full code Family Communication: Girl friend at bedside Disposition Plan: Discharge in 24 hours pending results of CK/BMP   Consultants:   None  Procedures:   None  Antimicrobials:  None    Subjective: Cramping improved in bilateral thighs but still present.  Objective: Vitals:   07/07/16 1753 07/07/16 2014 07/08/16 0421 07/08/16 0946  BP: 124/63 (!) 141/71 129/79 (!) 143/84  Pulse: 61 60 (!) 54 61  Resp: 18 20 18 17   Temp: 98 F (36.7 C) 98.1 F (36.7 C) 98.2 F (36.8 C) 97.7 F (36.5 C)  TempSrc: Oral   Oral  SpO2: 98% 100% 99% 98%  Weight:  114.8 kg (253 lb)    Height:        Intake/Output Summary (Last 24 hours) at 07/08/16 1111 Last data filed at 07/08/16 0947  Gross per 24 hour  Intake          7444.17 ml  Output             2825 ml  Net          4619.17 ml   Filed Weights   07/06/16 2016 07/07/16 0129 07/07/16 2014  Weight: 115.2 kg (254 lb)  109.8 kg (242 lb) 114.8 kg (253 lb)    Examination:  General exam: Appears calm and comfortable. No distress   Data Reviewed: I have personally reviewed following labs and imaging studies  CBC:  Recent Labs Lab 07/06/16 2018 07/07/16 0210  WBC 14.8* 14.2*  NEUTROABS 11.4*  --   HGB 16.6 14.4  HCT 49.8 42.0  MCV 93.3 91.7  PLT 325 284   Basic Metabolic Panel:  Recent Labs Lab 07/06/16 2025 07/07/16 0210 07/07/16 1804 07/08/16 0531  NA 138 136 138 137  K 4.1 3.8 3.9 4.0  CL 98* 103 106 107  CO2 18* 26 26 25   GLUCOSE 114* 127* 89 103*  BUN 23* 20 13 11   CREATININE 3.67* 2.05* 1.35* 1.34*  CALCIUM 10.9* 8.9 8.6* 8.3*  MG  --  2.2  --   --   PHOS  --  4.4  --   --    GFR: Estimated Creatinine Clearance: 114.8 mL/min (A) (by C-G formula based on SCr of 1.34 mg/dL (H)). Liver Function Tests:  Recent Labs Lab 07/06/16 2025 07/07/16 0210 07/07/16 1804 07/08/16 0531  AST 51* 39 63* 62*  ALT 40 27 28 26   ALKPHOS 60 53 43 43  BILITOT 1.0 0.7 0.6 0.6  PROT 8.9* 6.5 5.8* 5.2*  ALBUMIN 5.3* 3.8 3.2* 2.9*   No  results for input(s): LIPASE, AMYLASE in the last 168 hours. No results for input(s): AMMONIA in the last 168 hours. Coagulation Profile: No results for input(s): INR, PROTIME in the last 168 hours. Cardiac Enzymes:  Recent Labs Lab 07/06/16 2025 07/07/16 0210 07/07/16 1804 07/08/16 0531  CKTOTAL 929* 1,759* 4,051* 3,835*   BNP (last 3 results) No results for input(s): PROBNP in the last 8760 hours. HbA1C:  Recent Labs  07/07/16 0210  HGBA1C 4.8   CBG: No results for input(s): GLUCAP in the last 168 hours. Lipid Profile: No results for input(s): CHOL, HDL, LDLCALC, TRIG, CHOLHDL, LDLDIRECT in the last 72 hours. Thyroid Function Tests: No results for input(s): TSH, T4TOTAL, FREET4, T3FREE, THYROIDAB in the last 72 hours. Anemia Panel: No results for input(s): VITAMINB12, FOLATE, FERRITIN, TIBC, IRON, RETICCTPCT in the last 72  hours. Sepsis Labs: No results for input(s): PROCALCITON, LATICACIDVEN in the last 168 hours.  No results found for this or any previous visit (from the past 240 hour(s)).       Radiology Studies: No results found.      Scheduled Meds: . docusate sodium  100 mg Oral BID  . enoxaparin (LOVENOX) injection  30 mg Subcutaneous Q24H   Continuous Infusions: . lactated ringers 250 mL/hr at 07/08/16 0826     LOS: 0 days     Jacquelin Hawking, MD [facility] 07/08/2016, 11:11 AM Pager: [phone]  If 7PM-7AM, please contact night-coverage [email or web address] Password [email or web address] 07/08/2016, 11:11 AM

## 2016-07-09 LAB — COMPREHENSIVE METABOLIC PANEL
ALT: 32 U/L (ref 17–63)
AST: 90 U/L — AB (ref 15–41)
Albumin: 3 g/dL — ABNORMAL LOW (ref 3.5–5.0)
Alkaline Phosphatase: 56 U/L (ref 38–126)
Anion gap: 8 (ref 5–15)
BILIRUBIN TOTAL: 0.5 mg/dL (ref 0.3–1.2)
BUN: 8 mg/dL (ref 6–20)
CALCIUM: 8.6 mg/dL — AB (ref 8.9–10.3)
CHLORIDE: 103 mmol/L (ref 101–111)
CO2: 26 mmol/L (ref 22–32)
CREATININE: 1.23 mg/dL (ref 0.61–1.24)
Glucose, Bld: 98 mg/dL (ref 65–99)
Potassium: 4 mmol/L (ref 3.5–5.1)
Sodium: 137 mmol/L (ref 135–145)
TOTAL PROTEIN: 5.5 g/dL — AB (ref 6.5–8.1)

## 2016-07-09 LAB — CK: Total CK: 6770 U/L — ABNORMAL HIGH (ref 49–397)

## 2016-07-09 MED ORDER — SODIUM CHLORIDE 0.9 % IV BOLUS (SEPSIS)
1000.0000 mL | Freq: Once | INTRAVENOUS | Status: AC
  Administered 2016-07-09: 1000 mL via INTRAVENOUS

## 2016-07-09 NOTE — Progress Notes (Signed)
PROGRESS NOTE    Caleb Morrison  ZOX:[id number] DOB: 1995-09-05 DOA: 07/06/2016 PCP: System, Pcp Not In   Brief Narrative: July Nickson is a 21 y.o. with a medical history significant of recent rhabdomyolysis. Caleb Morrison presented with rhabdomyolysis and acute kidney injury secondary to exertion and dehydration.   Assessment & Plan:   Principal Problem:   Acute renal failure (ARF) (HCC) Active Problems:   Leukocytosis   Metabolic acidosis   Rhabdomyolysis   Hyperglycemia   Acute kidney injury (HCC)   Acute kidney injury Secondary to rhabdomyolysis. 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 AST increasing. -continue aggressive hydration -NS bolus -follow-up CK in AM  High anion gap metabolic acidosis Resolved with IV fluids. Hyperglycemia without ketones in urine.  Hyperglycemia Hemoglobin A1c of 4.8  Leukocytosis No evidence of infection. Likely stress reaction    DVT prophylaxis: Lovenox Code Status: Full code Family Communication: Girl friend at bedside Disposition Plan: Discharge pending downtrend of CK   Consultants:   None  Procedures:   None  Antimicrobials:  None    Subjective: Cramping improved. Caleb Morrison feels good.  Objective: Vitals:   07/08/16 1736 07/08/16 2200 07/09/16 0541 07/09/16 0956  BP: 132/80 (!) 153/88 130/75 137/90  Pulse: 75 (!) 55 74 (!) 56  Resp: 18 18 18 18   Temp: 98 F (36.7 C) 97.3 F (36.3 C) 98.1 F (36.7 C) 98.9 F (37.2 C)  TempSrc: Oral Oral Oral Oral  SpO2: 98% 97% 99% 97%  Weight:   115.3 kg (254 lb 1.6 oz)   Height:        Intake/Output Summary (Last 24 hours) at 07/09/16 1409 Last data filed at 07/09/16 1303  Gross per 24 hour  Intake          5766.66 ml  Output             4102 ml  Net          1664.66 ml   Filed Weights   07/07/16 0129 07/07/16 2014 07/09/16 0541    Weight: 109.8 kg (242 lb) 114.8 kg (253 lb) 115.3 kg (254 lb 1.6 oz)    Examination:  General exam: Appears calm and comfortable. No distress   Data Reviewed: I have personally reviewed following labs and imaging studies  CBC:  Recent Labs Lab 07/06/16 2018 07/07/16 0210  WBC 14.8* 14.2*  NEUTROABS 11.4*  --   HGB 16.6 14.4  HCT 49.8 42.0  MCV 93.3 91.7  PLT 325 284   Basic Metabolic Panel:  Recent Labs Lab 07/07/16 0210 07/07/16 1804 07/08/16 0531 07/08/16 1609 07/09/16 0425  NA 136 138 137 138 137  K 3.8 3.9 4.0 3.9 4.0  CL 103 106 107 105 103  CO2 26 26 25 27 26   GLUCOSE 127* 89 103* 90 98  BUN 20 13 11 8 8   CREATININE 2.05* 1.35* 1.34* 1.25* 1.23  CALCIUM 8.9 8.6* 8.3* 8.9 8.6*  MG 2.2  --   --   --   --   PHOS 4.4  --   --   --   --    GFR: Estimated Creatinine Clearance: 125.4 mL/min (by C-G formula based on SCr of 1.23 mg/dL). Liver Function Tests:  Recent Labs Lab 07/07/16 0210 07/07/16 1804 07/08/16 0531 07/08/16 1609 07/09/16 0425  AST 39 63* 62* 81* 90*  ALT 27 28 26  33 32  ALKPHOS 53 43  43 49 56  BILITOT 0.7 0.6 0.6 0.7 0.5  PROT 6.5 5.8* 5.2* 6.3* 5.5*  ALBUMIN 3.8 3.2* 2.9* 3.3* 3.0*   No results for input(s): LIPASE, AMYLASE in the last 168 hours. No results for input(s): AMMONIA in the last 168 hours. Coagulation Profile: No results for input(s): INR, PROTIME in the last 168 hours. Cardiac Enzymes:  Recent Labs Lab 07/07/16 0210 07/07/16 1804 07/08/16 0531 07/08/16 1609 07/09/16 0425  CKTOTAL 1,759* 4,051* 3,835* 5,154* 6,770*   BNP (last 3 results) No results for input(s): PROBNP in the last 8760 hours. HbA1C:  Recent Labs  07/07/16 0210  HGBA1C 4.8   CBG: No results for input(s): GLUCAP in the last 168 hours. Lipid Profile: No results for input(s): CHOL, HDL, LDLCALC, TRIG, CHOLHDL, LDLDIRECT in the last 72 hours. Thyroid Function Tests: No results for input(s): TSH, T4TOTAL, FREET4, T3FREE, THYROIDAB in the  last 72 hours. Anemia Panel: No results for input(s): VITAMINB12, FOLATE, FERRITIN, TIBC, IRON, RETICCTPCT in the last 72 hours. Sepsis Labs: No results for input(s): PROCALCITON, LATICACIDVEN in the last 168 hours.  No results found for this or any previous visit (from the past 240 hour(s)).       Radiology Studies: No results found.      Scheduled Meds: . docusate sodium  100 mg Oral BID  . enoxaparin (LOVENOX) injection  40 mg Subcutaneous Q24H   Continuous Infusions: . lactated ringers 250 mL/hr at 07/09/16 1056     LOS: 1 day     Jacquelin Hawkingalph Nettey, MD [facility] 07/09/2016, 2:09 PM Pager: [phone]  If 7PM-7AM, please contact night-coverage [email or web address] Password [email or web address] 07/09/2016, 2:09 PM

## 2016-07-10 DIAGNOSIS — R740 Nonspecific elevation of levels of transaminase and lactic acid dehydrogenase [LDH]: Secondary | ICD-10-CM

## 2016-07-10 LAB — COMPREHENSIVE METABOLIC PANEL
ALK PHOS: 43 U/L (ref 38–126)
ALT: 43 U/L (ref 17–63)
AST: 121 U/L — AB (ref 15–41)
Albumin: 3.3 g/dL — ABNORMAL LOW (ref 3.5–5.0)
Anion gap: 6 (ref 5–15)
BILIRUBIN TOTAL: 0.7 mg/dL (ref 0.3–1.2)
BUN: 6 mg/dL (ref 6–20)
CALCIUM: 9 mg/dL (ref 8.9–10.3)
CHLORIDE: 103 mmol/L (ref 101–111)
CO2: 31 mmol/L (ref 22–32)
CREATININE: 1.32 mg/dL — AB (ref 0.61–1.24)
Glucose, Bld: 106 mg/dL — ABNORMAL HIGH (ref 65–99)
Potassium: 4.1 mmol/L (ref 3.5–5.1)
Sodium: 140 mmol/L (ref 135–145)
TOTAL PROTEIN: 5.6 g/dL — AB (ref 6.5–8.1)

## 2016-07-10 LAB — CK: CK TOTAL: 9446 U/L — AB (ref 49–397)

## 2016-07-10 MED ORDER — SODIUM CHLORIDE 0.9 % IV SOLN
INTRAVENOUS | Status: AC
  Administered 2016-07-10 – 2016-07-11 (×4): via INTRAVENOUS

## 2016-07-10 NOTE — Progress Notes (Addendum)
PROGRESS NOTE                                                                                                                                                                                                             Patient Demographics:    Caleb Morrison, is a 21 y.o. male, DOB - [birth year], JXB:[id number]  Admit date - 07/06/2016   Admitting Physician Jonah Blue, MD  Outpatient Primary MD for the patient is System, Pcp Not In  LOS - 2  Outpatient Specialists: none  Chief Complaint  Patient presents with  . Heat Exposure    Rabdo       Brief Narrative   21 year old male with recent hospitalization for acute kidney injury due to rhabdomyolysis after working outside in the heat. He was hospitalized from 5/23-5/25 but returned on 5/31 with diffuse muscle cramping after he got back to work that morning. He was again found to have acute kidney injury with rhabdomyolysis. Admitted for IV hydration.   Subjective:    Patient denies any muscle cramping, nausea or vomiting.  Assessment  & Plan :    Principal Problem:   Acute renal failure (ARF) (HCC) Secondary to rhabdomyolysis. Improving with IV fluids. Switch LR to normal saline.  Active Problems: Rhabdomyolysis Secondary to heat exhaustion. CK trending up since admission (9444 today). Hopefully has peaked and showed start to improve. Symptoms have resolved. Switched Ringer's lactate to normal saline and continue aggressive hydration (2 50 mL/h). Encouraged oral hydration. AST also continues to worsen.(AST is commonly elevated in rhabdomyolysis)  If no improvement in a.m. labs will consult nephrology.     Metabolic acidosis Resolved with hydration.  Tobacco and marijuana use Counseled on cessation.  1.5 cm echogenic focus in the right lobe of liver Incidentally seen on abdominal ultrasound. Asymptomatic. Needs outpatient follow-up.  1.    Code Status :  Full code  Family Communication  : None at bedside  Disposition: Home once improved  Barriers For Discharge : Active symptoms  Consults  :  None  Procedures  : None  DVT Prophylaxis  :  Lovenox -   Lab Results  Component Value Date   PLT 284 07/07/2016    Antibiotics  :  Anti-infectives    None        Objective:   Vitals:   07/09/16 1703  07/09/16 2127 07/10/16 0642 07/10/16 0944  BP: (!) 162/75 (!) 133/97 (!) 150/72 (!) 148/86  Pulse: (!) 56 (!) 53 (!) 58 (!) 54  Resp: 18 18 18 18   Temp: 98.5 F (36.9 C) 98.5 F (36.9 C) 98.3 F (36.8 C) 97.7 F (36.5 C)  TempSrc: Oral Oral Oral Oral  SpO2: 99% 99% 100% 98%  Weight:  115.3 kg (254 lb 1.6 oz)    Height:        Wt Readings from Last 3 Encounters:  07/09/16 115.3 kg (254 lb 1.6 oz)  06/30/16 115.4 kg (254 lb 6.4 oz)  04/25/16 108.9 kg (240 lb)     Intake/Output Summary (Last 24 hours) at 07/10/16 1319 Last data filed at 07/10/16 1241  Gross per 24 hour  Intake          5640.01 ml  Output             6050 ml  Net          -409.99 ml     Physical Exam  Gen: not in distress HEENT:moist mucosa, supple neck Chest: clear b/l, no added sounds CVS: N S1&S2, no murmurs, GI: soft, NT, ND, Musculoskeletal: warm, no edema     Data Review:    CBC  Recent Labs Lab 07/06/16 2018 07/07/16 0210  WBC 14.8* 14.2*  HGB 16.6 14.4  HCT 49.8 42.0  PLT 325 284  MCV 93.3 91.7  MCH 31.1 31.4  MCHC 33.3 34.3  RDW 12.7 12.7  LYMPHSABS 2.3  --   MONOABS 1.0  --   EOSABS 0.0  --   BASOSABS 0.0  --     Chemistries   Recent Labs Lab 07/07/16 0210 07/07/16 1804 07/08/16 0531 07/08/16 1609 07/09/16 0425 07/10/16 0526  NA 136 138 137 138 137 140  K 3.8 3.9 4.0 3.9 4.0 4.1  CL 103 106 107 105 103 103  CO2 26 26 25 27 26 31   GLUCOSE 127* 89 103* 90 98 106*  BUN 20 13 11 8 8 6   CREATININE 2.05* 1.35* 1.34* 1.25* 1.23 1.32*  CALCIUM 8.9 8.6* 8.3* 8.9 8.6* 9.0  MG 2.2  --   --   --   --   --   AST  39 63* 62* 81* 90* 121*  ALT 27 28 26  33 32 43  ALKPHOS 53 43 43 49 56 43  BILITOT 0.7 0.6 0.6 0.7 0.5 0.7   ------------------------------------------------------------------------------------------------------------------ No results for input(s): CHOL, HDL, LDLCALC, TRIG, CHOLHDL, LDLDIRECT in the last 72 hours.  Lab Results  Component Value Date   HGBA1C 4.8 07/07/2016   ------------------------------------------------------------------------------------------------------------------ No results for input(s): TSH, T4TOTAL, T3FREE, THYROIDAB in the last 72 hours.  Invalid input(s): FREET3 ------------------------------------------------------------------------------------------------------------------ No results for input(s): VITAMINB12, FOLATE, FERRITIN, TIBC, IRON, RETICCTPCT in the last 72 hours.  Coagulation profile No results for input(s): INR, PROTIME in the last 168 hours.  No results for input(s): DDIMER in the last 72 hours.  Cardiac Enzymes  Recent Labs Lab 07/07/16 0210  MYOGLOBIN 1,146*   ------------------------------------------------------------------------------------------------------------------ No results found for: BNP  Inpatient Medications  Scheduled Meds: . docusate sodium  100 mg Oral BID  . enoxaparin (LOVENOX) injection  40 mg Subcutaneous Q24H   Continuous Infusions: . sodium chloride 250 mL/hr at 07/10/16 1103   PRN Meds:.acetaminophen **OR** acetaminophen, ondansetron **OR** ondansetron (ZOFRAN) IV  Micro Results No results found for this or any previous visit (from the past 240 hour(s)).  Radiology Reports [facility]  Result Date: 06/28/2016 CLINICAL DATA:  21 y/o  M; acute kidney injury. EXAM: RENAL / URINARY TRACT ULTRASOUND COMPLETE COMPARISON:  None. FINDINGS: Right Kidney: Length: 11.5 cm. Echogenicity within normal limits. No mass or hydronephrosis visualized. Left Kidney: Length: 12.0 cm. Echogenicity within normal limits. No mass  or hydronephrosis visualized. Bladder: Appears normal for degree of bladder distention. Bilateral ureteral jets visualized. Other:  1.5 cm echogenic focus in the right lobe of liver. IMPRESSION: 1. Unremarkable appearance of the kidneys.  No hydronephrosis. 2. 1.5 cm echogenic focus in right lobe of liver, this is probably a hemangioma. Consider follow-up with ultrasound to ensure stability. Electronically Signed   By: Mitzi Hansen M.D.   On: 06/28/2016 20:26    Time Spent in minutes  25   Eddie North M.D on 07/10/2016 at 1:19 PM  Between 7am to 7pm - Pager - [phone]  After 7pm go to [email or web address] - password [facility]  [facility] -  Office  [phone]

## 2016-07-11 DIAGNOSIS — R030 Elevated blood-pressure reading, without diagnosis of hypertension: Secondary | ICD-10-CM

## 2016-07-11 LAB — COMPREHENSIVE METABOLIC PANEL
ALBUMIN: 3.4 g/dL — AB (ref 3.5–5.0)
ALT: 45 U/L (ref 17–63)
AST: 114 U/L — AB (ref 15–41)
Alkaline Phosphatase: 49 U/L (ref 38–126)
Anion gap: 9 (ref 5–15)
BUN: 7 mg/dL (ref 6–20)
CHLORIDE: 102 mmol/L (ref 101–111)
CO2: 27 mmol/L (ref 22–32)
Calcium: 9 mg/dL (ref 8.9–10.3)
Creatinine, Ser: 1.33 mg/dL — ABNORMAL HIGH (ref 0.61–1.24)
GFR calc non Af Amer: >60 mL/min (ref >60)
GLUCOSE: 92 mg/dL (ref 65–99)
Potassium: 3.9 mmol/L (ref 3.5–5.1)
SODIUM: 138 mmol/L (ref 135–145)
Total Bilirubin: 0.5 mg/dL (ref 0.3–1.2)
Total Protein: 5.7 g/dL — ABNORMAL LOW (ref 6.5–8.1)

## 2016-07-11 LAB — MYOGLOBIN, SERUM: MYOGLOBIN: 651 ng/mL — AB (ref 28–72)

## 2016-07-11 LAB — CK: Total CK: 7815 U/L — ABNORMAL HIGH (ref 49–397)

## 2016-07-11 MED ORDER — SODIUM CHLORIDE 0.9 % IV SOLN
INTRAVENOUS | Status: AC
  Administered 2016-07-11 – 2016-07-12 (×5): via INTRAVENOUS

## 2016-07-11 MED ORDER — HYDRALAZINE HCL 20 MG/ML IJ SOLN
10.0000 mg | Freq: Four times a day (QID) | INTRAMUSCULAR | Status: DC | PRN
  Administered 2016-07-11: 10 mg via INTRAVENOUS
  Filled 2016-07-11: qty 1

## 2016-07-11 NOTE — Progress Notes (Signed)
B/P is 153/126. MD notified. Orders placed and followed.Will continue to monitor.

## 2016-07-11 NOTE — Progress Notes (Signed)
PROGRESS NOTE                                                                                                                                                                                                             Patient Demographics:    Caleb ClayJustin Morrison, is a 21 y.o. male, DOB - December 22, 1995, ZOX:[id number]  Admit date - 07/06/2016   Admitting Physician Jonah BlueJennifer Yates, MD  Outpatient Primary MD for the patient is System, Pcp Not In  LOS - 3  Outpatient Specialists: none  Chief Complaint  Patient presents with  . Heat Exposure    Rabdo       Brief Narrative   21 year old male with recent hospitalization for acute kidney injury due to rhabdomyolysis after working outside in the heat. He was hospitalized from 5/23-5/25 but returned on 5/31 with diffuse muscle cramping after he got back to work that morning. He was again found to have acute kidney injury with rhabdomyolysis. Admitted for IV hydration.   Subjective:   Denies any pain in muscles or cramps.  Assessment  & Plan :    Principal Problem:   Acute renal failure (ARF) (HCC) Secondary to rhabdomyolysis. Improving with hydration. Continue aggressive IV fluids.   Active Problems: Rhabdomyolysis Secondary to heat exhaustion. CK appears to have peaked around 8119110000 and now slowly trending down (7.5K today). Renal function better. AST trending down. Myoglobin improved since admission. Encouraged oral hydration.  Accelerated blood pressure Blood pressure has been stable in the past. Place on when necessary hydralazine and monitor.    Metabolic acidosis Resolved with hydration.  Tobacco and marijuana use Counseled on cessation.  1.5 cm echogenic focus in the right lobe of liver Incidentally seen on abdominal ultrasound. Asymptomatic. Needs outpatient follow-up.      Code Status : Full code  Family Communication  : None at bedside  Disposition: Home once  improved  Barriers For Discharge : Active symptoms  Consults  :  None  Procedures  : None  DVT Prophylaxis  :  Lovenox -   Lab Results  Component Value Date   PLT 284 07/07/2016    Antibiotics  :  Anti-infectives    None        Objective:   Vitals:   07/10/16 0944 07/10/16 1757 07/11/16 0637 07/11/16 0949  BP: (!) 148/86 (!) 152/82 Marland Kitchen(!)  154/90 (!) 153/126  Pulse: (!) 54 (!) 54 (!) 50 61  Resp: 18 16 16 17   Temp: 97.7 F (36.5 C) 97.6 F (36.4 C) 97.8 F (36.6 C) 97.5 F (36.4 C)  TempSrc: Oral Oral Oral Oral  SpO2: 98% 100% 100% 100%  Weight:      Height:        Wt Readings from Last 3 Encounters:  07/09/16 115.3 kg (254 lb 1.6 oz)  06/30/16 115.4 kg (254 lb 6.4 oz)  04/25/16 108.9 kg (240 lb)     Intake/Output Summary (Last 24 hours) at 07/11/16 1059 Last data filed at 07/11/16 1045  Gross per 24 hour  Intake          4358.33 ml  Output             7275 ml  Net         -2916.67 ml     Physical Exam Gen.: Young male not in distress HEENT: Moist, supple neck Chest: Clear bilaterally  CNS: Normal S1 and S2, no murmurs GI: Soft, nondistended, nontender Musculoskeletal: Warm, no edema,       Data Review:    CBC  Recent Labs Lab 07/06/16 2018 07/07/16 0210  WBC 14.8* 14.2*  HGB 16.6 14.4  HCT 49.8 42.0  PLT 325 284  MCV 93.3 91.7  MCH 31.1 31.4  MCHC 33.3 34.3  RDW 12.7 12.7  LYMPHSABS 2.3  --   MONOABS 1.0  --   EOSABS 0.0  --   BASOSABS 0.0  --     Chemistries   Recent Labs Lab 07/07/16 0210  07/08/16 0531 07/08/16 1609 07/09/16 0425 07/10/16 0526 07/11/16 0549  NA 136  < > 137 138 137 140 138  K 3.8  < > 4.0 3.9 4.0 4.1 3.9  CL 103  < > 107 105 103 103 102  CO2 26  < > 25 27 26 31 27   GLUCOSE 127*  < > 103* 90 98 106* 92  BUN 20  < > 11 8 8 6 7   CREATININE 2.05*  < > 1.34* 1.25* 1.23 1.32* 1.33*  CALCIUM 8.9  < > 8.3* 8.9 8.6* 9.0 9.0  MG 2.2  --   --   --   --   --   --   AST 39  < > 62* 81* 90* 121* 114*    ALT 27  < > 26 33 32 43 45  ALKPHOS 53  < > 43 49 56 43 49  BILITOT 0.7  < > 0.6 0.7 0.5 0.7 0.5  < > = values in this interval not displayed. ------------------------------------------------------------------------------------------------------------------ No results for input(s): CHOL, HDL, LDLCALC, TRIG, CHOLHDL, LDLDIRECT in the last 72 hours.  Lab Results  Component Value Date   HGBA1C 4.8 07/07/2016   ------------------------------------------------------------------------------------------------------------------ No results for input(s): TSH, T4TOTAL, T3FREE, THYROIDAB in the last 72 hours.  Invalid input(s): FREET3 ------------------------------------------------------------------------------------------------------------------ No results for input(s): VITAMINB12, FOLATE, FERRITIN, TIBC, IRON, RETICCTPCT in the last 72 hours.  Coagulation profile No results for input(s): INR, PROTIME in the last 168 hours.  No results for input(s): DDIMER in the last 72 hours.  Cardiac Enzymes  Recent Labs Lab 07/07/16 0210 07/10/16 1409  MYOGLOBIN 1,146* 651*   ------------------------------------------------------------------------------------------------------------------ No results found for: BNP  Inpatient Medications  Scheduled Meds: . docusate sodium  100 mg Oral BID  . enoxaparin (LOVENOX) injection  40 mg Subcutaneous Q24H   Continuous Infusions: . sodium chloride 200 mL/hr at 07/11/16  1040   PRN Meds:.acetaminophen **OR** acetaminophen, hydrALAZINE, ondansetron **OR** ondansetron (ZOFRAN) IV  Micro Results No results found for this or any previous visit (from the past 240 hour(s)).  Radiology Reports US Renal  Result Date: 06/28/2016 CLINICAL DATA:  21 y/o  M; acute kidney injury. EXAM: RENAL / URINARY TRACT ULTRASOUND COMPLETE COMPARISON:  None. FINDINGS: Right Kidney: Length: 11.5 cm. Echogenicity within normal limits. No mass or hydronephrosis visualized. Left  Kidney: Length: 12.0 cm. Echogenicity within normal limits. No mass or hydronephrosis visualized. Bladder: Appears normal for degree of bladder distention. Bilateral ureteral jets visualized. Other:  1.5 cm echogenic focus in the right lobe of liver. IMPRESSION: 1. Unremarkable appearance of the kidneys.  No hydronephrosis. 2. 1.5 cm echogenic focus in right lobe of liver, this is probably a hemangioma. Consider follow-up with ultrasound to ensure stability. Electronically Signed   By: Mitzi Hansen M.D.   On: 06/28/2016 20:26    Time Spent in minutes  25   Eddie North M.D on 07/11/2016 at 10:59 AM  Between 7am to 7pm - Pager - [phone]  After 7pm go to [email or web address] - password [facility]  [facility] -  Office  [phone]

## 2016-07-12 DIAGNOSIS — M6282 Rhabdomyolysis: Secondary | ICD-10-CM

## 2016-07-12 DIAGNOSIS — N179 Acute kidney failure, unspecified: Principal | ICD-10-CM

## 2016-07-12 DIAGNOSIS — E872 Acidosis: Secondary | ICD-10-CM

## 2016-07-12 LAB — CK: Total CK: 4430 U/L — ABNORMAL HIGH (ref 49–397)

## 2016-07-12 MED ORDER — SODIUM CHLORIDE 0.9 % IV BOLUS (SEPSIS)
1000.0000 mL | Freq: Once | INTRAVENOUS | Status: AC
  Administered 2016-07-12: 1000 mL via INTRAVENOUS

## 2016-07-12 MED ORDER — SODIUM CHLORIDE 0.9 % IV SOLN
INTRAVENOUS | Status: AC
  Administered 2016-07-12: 250 mL via INTRAVENOUS
  Administered 2016-07-12: 12:00:00 via INTRAVENOUS
  Administered 2016-07-12: 1000 mL via INTRAVENOUS
  Administered 2016-07-12 – 2016-07-13 (×2): via INTRAVENOUS
  Administered 2016-07-13: 1000 mL via INTRAVENOUS

## 2016-07-12 NOTE — Progress Notes (Signed)
PROGRESS NOTE                                                                                                                                                                                                             Patient Demographics:    Caleb Morrison, is a 21 y.o. male, DOB - 1995-02-25, ZOX:[id number]  Admit date - 07/06/2016   Admitting Physician Jonah Blue, MD  Outpatient Primary MD for the patient is System, Pcp Not In  LOS - 4  Outpatient Specialists: none  Chief Complaint  Patient presents with  . Heat Exposure    Rabdo       Brief Narrative   21 year old male with recent hospitalization for acute kidney injury due to rhabdomyolysis after working outside in the heat. He was hospitalized from 5/23-5/25 but returned on 5/31 with diffuse muscle cramping after he got back to work that morning. He was again found to have acute kidney injury with rhabdomyolysis. Admitted for IV hydration.   Subjective:   Denies any complaints, no muscle pain or cramps, no nausea or vomiting  Assessment  & Plan :    Principal Problem: Acute renal failure (ARF) (HCC) Secondary to rhabdomyolysis. Improving with IV fluids, will increase rate at 2 50 mL/h, received 1 fluid bolus as well   Rhabdomyolysis - Secondary to heat exhaustion,  CK appears to have peaked around 81191 and currently trending down, but still significantly elevated, total CK is 4.4 today, so will continue with IV fluids, will increase rate, negative couple of fluid boluses during the day  Encouraged oral hydration.  Elevated AST - Continue with IV fluids, recheck in a.m.  Accelerated blood pressure Blood pressure has been stable in the past. Place on when necessary hydralazine and monitor.    Metabolic acidosis Resolved with hydration.  Tobacco and marijuana use Counseled on cessation.  1.5 cm echogenic focus in the right lobe of liver Incidentally  seen on abdominal ultrasound. Asymptomatic. Needs outpatient follow-up.      Code Status : Full code  Family Communication  : None at bedside  Disposition: Home once improved  Consults  :  None  Procedures  : None  DVT Prophylaxis  :  Lovenox -   Lab Results  Component Value Date   PLT 284 07/07/2016    Antibiotics  :  Anti-infectives  None        Objective:   Vitals:   07/11/16 1704 07/11/16 2047 07/12/16 0532 07/12/16 1005  BP: (!) 169/84 (!) 116/96 (!) 144/75 (!) 151/93  Pulse: 64 (!) 52 (!) 54 74  Resp: 17 (!) 21 16 16   Temp: 98 F (36.7 C) 98.3 F (36.8 C) 97.6 F (36.4 C) 98.2 F (36.8 C)  TempSrc: Oral   Oral  SpO2: 100% 95% 99% 98%  Weight:      Height:        Wt Readings from Last 3 Encounters:  07/09/16 115.3 kg (254 lb 1.6 oz)  06/30/16 115.4 kg (254 lb 6.4 oz)  04/25/16 108.9 kg (240 lb)     Intake/Output Summary (Last 24 hours) at 07/12/16 1247 Last data filed at 07/12/16 1222  Gross per 24 hour  Intake          4429.99 ml  Output             6550 ml  Net         -2120.01 ml     Physical Exam Gen.: Young male in no apparent distress  HEENT: Supple neck, no JVD Chest: Good air entry bilaterally, clear to auscultation CNS: RRR, S1 and S2. No rubs murmurs gallops GI: Soft, nondistended, nontender Musculoskeletal: Warm, no edema,       Data Review:    CBC  Recent Labs Lab 07/06/16 2018 07/07/16 0210  WBC 14.8* 14.2*  HGB 16.6 14.4  HCT 49.8 42.0  PLT 325 284  MCV 93.3 91.7  MCH 31.1 31.4  MCHC 33.3 34.3  RDW 12.7 12.7  LYMPHSABS 2.3  --   MONOABS 1.0  --   EOSABS 0.0  --   BASOSABS 0.0  --     Chemistries   Recent Labs Lab 07/07/16 0210  07/08/16 0531 07/08/16 1609 07/09/16 0425 07/10/16 0526 07/11/16 0549  NA 136  < > 137 138 137 140 138  K 3.8  < > 4.0 3.9 4.0 4.1 3.9  CL 103  < > 107 105 103 103 102  CO2 26  < > 25 27 26 31 27   GLUCOSE 127*  < > 103* 90 98 106* 92  BUN 20  < > 11 8 8 6 7     CREATININE 2.05*  < > 1.34* 1.25* 1.23 1.32* 1.33*  CALCIUM 8.9  < > 8.3* 8.9 8.6* 9.0 9.0  MG 2.2  --   --   --   --   --   --   AST 39  < > 62* 81* 90* 121* 114*  ALT 27  < > 26 33 32 43 45  ALKPHOS 53  < > 43 49 56 43 49  BILITOT 0.7  < > 0.6 0.7 0.5 0.7 0.5  < > = values in this interval not displayed. ------------------------------------------------------------------------------------------------------------------ No results for input(s): CHOL, HDL, LDLCALC, TRIG, CHOLHDL, LDLDIRECT in the last 72 hours.  Lab Results  Component Value Date   HGBA1C 4.8 07/07/2016   ------------------------------------------------------------------------------------------------------------------ No results for input(s): TSH, T4TOTAL, T3FREE, THYROIDAB in the last 72 hours.  Invalid input(s): FREET3 ------------------------------------------------------------------------------------------------------------------ No results for input(s): VITAMINB12, FOLATE, FERRITIN, TIBC, IRON, RETICCTPCT in the last 72 hours.  Coagulation profile No results for input(s): INR, PROTIME in the last 168 hours.  No results for input(s): DDIMER in the last 72 hours.  Cardiac Enzymes  Recent Labs Lab 07/07/16 0210 07/10/16 1409  MYOGLOBIN 1,146* 651*   ------------------------------------------------------------------------------------------------------------------ No results  found for: BNP  Inpatient Medications  Scheduled Meds: . docusate sodium  100 mg Oral BID  . enoxaparin (LOVENOX) injection  40 mg Subcutaneous Q24H   Continuous Infusions: . sodium chloride 200 mL/hr at 07/12/16 1221  . sodium chloride     PRN Meds:.acetaminophen **OR** acetaminophen, hydrALAZINE, ondansetron **OR** ondansetron (ZOFRAN) IV  Micro Results No results found for this or any previous visit (from the past 240 hour(s)).  Radiology Reports US Renal  Result Date: 06/28/2016 CLINICAL DATA:  21 y/o  M; acute kidney  injury. EXAM: RENAL / URINARY TRACT ULTRASOUND COMPLETE COMPARISON:  None. FINDINGS: Right Kidney: Length: 11.5 cm. Echogenicity within normal limits. No mass or hydronephrosis visualized. Left Kidney: Length: 12.0 cm. Echogenicity within normal limits. No mass or hydronephrosis visualized. Bladder: Appears normal for degree of bladder distention. Bilateral ureteral jets visualized. Other:  1.5 cm echogenic focus in the right lobe of liver. IMPRESSION: 1. Unremarkable appearance of the kidneys.  No hydronephrosis. 2. 1.5 cm echogenic focus in right lobe of liver, this is probably a hemangioma. Consider follow-up with ultrasound to ensure stability. Electronically Signed   By: Mitzi Hansen M.D.   On: 06/28/2016 20:26     Randol Kern, Jazyah Butsch M.D on 07/12/2016 at 12:47 PM  Between 7am to 7pm - Pager - [phone]  After 7pm go to [email or web address] - password [facility]  [facility] -  Office  [phone]

## 2016-07-12 NOTE — Progress Notes (Signed)
Assumed care of patient after receiving report from SproulRebecca, [place]

## 2016-07-13 LAB — COMPREHENSIVE METABOLIC PANEL
ALT: 38 U/L (ref 17–63)
AST: 60 U/L — AB (ref 15–41)
Albumin: 3.4 g/dL — ABNORMAL LOW (ref 3.5–5.0)
Alkaline Phosphatase: 56 U/L (ref 38–126)
Anion gap: 9 (ref 5–15)
BUN: 9 mg/dL (ref 6–20)
CHLORIDE: 103 mmol/L (ref 101–111)
CO2: 25 mmol/L (ref 22–32)
Calcium: 8.7 mg/dL — ABNORMAL LOW (ref 8.9–10.3)
Creatinine, Ser: 1.3 mg/dL — ABNORMAL HIGH (ref 0.61–1.24)
Glucose, Bld: 106 mg/dL — ABNORMAL HIGH (ref 65–99)
POTASSIUM: 3.9 mmol/L (ref 3.5–5.1)
SODIUM: 137 mmol/L (ref 135–145)
Total Bilirubin: 0.6 mg/dL (ref 0.3–1.2)
Total Protein: 5.7 g/dL — ABNORMAL LOW (ref 6.5–8.1)

## 2016-07-13 LAB — CK: Total CK: 2630 U/L — ABNORMAL HIGH (ref 49–397)

## 2016-07-13 NOTE — Progress Notes (Signed)
Discharging patient to home. Dr. Sheryle Hailiamond in to speak with patient this am regarding importance of hydration. Verbalized understanding. Denies pain, a/ox4. Leaving unit via wheelchair

## 2016-07-13 NOTE — Discharge Summary (Signed)
Caleb Morrison, is a 21 y.o. male  DOB 31-Oct-1995  MRN [id number].  Admission date:  07/06/2016  Admitting Physician  Jonah Blue, MD  Discharge Date:  07/13/2016   Primary MD  System, Pcp Not In  Recommendations for primary care physician for things to follow:  - Check CBC, BMP during next visit - Please monitor blood pressure closely   Admission Diagnosis  AKI (acute kidney injury) (HCC) [N17.9] Non-traumatic rhabdomyolysis [M62.82]   Discharge Diagnosis  AKI (acute kidney injury) (HCC) [N17.9] Non-traumatic rhabdomyolysis [M62.82]    Principal Problem:   Acute renal failure (ARF) (HCC) Active Problems:   Leukocytosis   Metabolic acidosis   Rhabdomyolysis   Hyperglycemia   Acute kidney injury [facility])      Past Medical History:  Diagnosis Date  . Rhabdomyolysis     Past Surgical History:  Procedure Laterality Date  . ELBOW SURGERY         History of present illness and  Hospital Course:     Kindly see H&P for history of present illness and admission details, please review complete Labs, Consult reports and Test reports for all details in brief  HPI  from the history and physical done on the day of admission 07/06/2016 HPI: Caleb Morrison is a 21 y.o. male with medical history significant of hospitalization from 5/23-25/18 for AKI and heat exhaustion presenting with the same.  He reports "I lost too much water."  Started feeling bad about 230 Morrison.  3-4 weeks since last alcohol. He just went back to work yesterday.  He felt fine then and this AM but started feeling bad again.  No UOP for several hours.  Diffuse muscle cramping noted.   ED Course: CK 900, Creatinine 3.67, 1L bolus and LR at 250 cc/hour   Hospital Course  21 year old male with recent hospitalization for acute kidney injury due to rhabdomyolysis after working outside in the heat. He was hospitalized from 5/23-5/25 but  returned on 5/31 with diffuse muscle cramping after he got back to work that morning. He was again found to have acute kidney injury with rhabdomyolysis. Admitted for IV hydration.  Acute renal failure (ARF) (HCC) Secondary to rhabdomyolysis. Improving with IV fluids, creatinine back to baseline 1.3 Morrison.  Rhabdomyolysis - Secondary to heat exhaustion,  CK appears to have peaked around 14782 , kept on aggressive IV hydration, , total CK is 2600 Morrison, so patient will be discharged Morrison, he was encouraged to continue drinking plenty of fluids, and to avoid hot weather, and excessive sweating .  Elevated AST - Improving, AST is 60 Morrison  Accelerated blood pressure Blood pressure has been stable in the past. That has been uncontrolled recently, nothing this is most likely due to aggressive IV hydration, will place him on any medication, but he was instructed to follow with PCP closely regarding that (reports he will find PCP Morrison and schedule an appointment with him ).  Metabolic acidosis Resolved with hydration.  Tobacco and marijuana use Counseled on cessation.  1.5 cm  echogenic focus in the right lobe of liver Incidentally seen on abdominal ultrasound. Asymptomatic. Needs outpatient follow-up. Patient was informed about this finding and importance of follow-up    Discharge Condition:  stable   Follow UP With PCP     Discharge Instructions    Discharge instructions    Complete by:  As directed    Follow with Primary MD  in 7 days   Get CBC, CMP,  checked  by Primary MD next visit.    Activity: As tolerated with Full fall precautions use walker/cane & assistance as needed   Disposition Home    Diet: Regular diet, encouraged to drink plenty of fluids,    On your next visit with your primary care physician please Get Medicines reviewed and adjusted.   Please request your Prim.MD to go over all Hospital Tests and Procedure/Radiological results at the follow  up, please get all Hospital records sent to your Prim MD by signing hospital release before you go home.   If you experience worsening of your admission symptoms, develop shortness of breath, life threatening emergency, suicidal or homicidal thoughts you must seek medical attention immediately by calling 911 or calling your MD immediately  if symptoms less severe.  You Must read complete instructions/literature along with all the possible adverse reactions/side effects for all the Medicines you take and that have been prescribed to you. Take any new Medicines after you have completely understood and accpet all the possible adverse reactions/side effects.   Do not drive, operating heavy machinery, perform activities at heights, swimming or participation in water activities or provide baby sitting services if your were admitted for syncope or siezures until you have seen by Primary MD or a Neurologist and advised to do so again.  Do not drive when taking Pain medications.    Do not take more than prescribed Pain, Sleep and Anxiety Medications  Special Instructions: If you have smoked or chewed Tobacco  in the last 2 yrs please stop smoking, stop any regular Alcohol  and or any Recreational drug use.  Wear Seat belts while driving.   Please note  You were cared for by a hospitalist during your hospital stay. If you have any questions about your discharge medications or the care you received while you were in the hospital after you are discharged, you can call the unit and asked to speak with the hospitalist on call if the hospitalist that took care of you is not available. Once you are discharged, your primary care physician will handle any further medical issues. Please note that NO REFILLS for any discharge medications will be authorized once you are discharged, as it is imperative that you return to your primary care physician (or establish a relationship with a primary care physician if you do  not have one) for your aftercare needs so that they can reassess your need for medications and monitor your lab values.   Increase activity slowly    Complete by:  As directed      Allergies as of 07/13/2016   No Known Allergies     Medication List    You have not been prescribed any medications.       Diet and Activity recommendation: See Discharge Instructions above   Consults obtained -  None   Major procedures and Radiology Reports - PLEASE review detailed and final reports for all details, in brief -      US Renal  Result Date: 06/28/2016 CLINICAL DATA:  20  y/o  M; acute kidney injury. EXAM: RENAL / URINARY TRACT ULTRASOUND COMPLETE COMPARISON:  None. FINDINGS: Right Kidney: Length: 11.5 cm. Echogenicity within normal limits. No mass or hydronephrosis visualized. Left Kidney: Length: 12.0 cm. Echogenicity within normal limits. No mass or hydronephrosis visualized. Bladder: Appears normal for degree of bladder distention. Bilateral ureteral jets visualized. Other:  1.5 cm echogenic focus in the right lobe of liver. IMPRESSION: 1. Unremarkable appearance of the kidneys.  No hydronephrosis. 2. 1.5 cm echogenic focus in right lobe of liver, this is probably a hemangioma. Consider follow-up with ultrasound to ensure stability. Electronically Signed   By: Mitzi HansenLance  Furusawa-Stratton M.D.   On: 06/28/2016 20:26    Micro Results    No results found for this or any previous visit (from the past 240 hour(s)).     Morrison   Subjective:   Caleb Morrison has no headache,no chest abdominal pain,no new weakness tingling or numbness, feels much better wants to go home Morrison.  Objective:   Blood pressure (!) 151/86, pulse 62, temperature 98.6 F (37 C), temperature source Oral, resp. rate 18, height 6\' 1"  (1.854 m), weight 112.9 kg (249 lb), SpO2 100 %.   Intake/Output Summary (Last 24 hours) at 07/13/16 1150 Last data filed at 07/13/16 1012  Gross per 24 hour  Intake            8377.5 ml  Output             8225 ml  Net            152.5 ml    Exam Awake Alert, Oriented x 3, No new F.N deficits, Normal affect Symmetrical Chest wall movement, Good air movement bilaterally, CTAB RRR,No Gallops,Rubs or new Murmurs, No Parasternal Heave +ve B.Sounds, Abd Soft, Non tender,  No rebound -guarding or rigidity. No Cyanosis, Clubbing or edema, No new Rash or bruise  Data Review   CBC w Diff: Lab Results  Component Value Date   WBC 14.2 (H) 07/07/2016   HGB 14.4 07/07/2016   HCT 42.0 07/07/2016   PLT 284 07/07/2016   LYMPHOPCT 16 07/06/2016   MONOPCT 7 07/06/2016   EOSPCT 0 07/06/2016   BASOPCT 0 07/06/2016    CMP: Lab Results  Component Value Date   NA 137 07/13/2016   K 3.9 07/13/2016   CL 103 07/13/2016   CO2 25 07/13/2016   BUN 9 07/13/2016   CREATININE 1.30 (H) 07/13/2016   PROT 5.7 (L) 07/13/2016   ALBUMIN 3.4 (L) 07/13/2016   BILITOT 0.6 07/13/2016   ALKPHOS 56 07/13/2016   AST 60 (H) 07/13/2016   ALT 38 07/13/2016  .   Total Time in preparing paper work, data evaluation and todays exam - 35 minutes  Jaymari Cromie M.D on 07/13/2016 at 11:50 AM  [facility]   Office  [phone]

## 2016-07-13 NOTE — Discharge Instructions (Signed)
Follow with Primary MD  in 7 days   Get CBC, CMP,  checked  by Primary MD next visit.    Activity: As tolerated with Full fall precautions use walker/cane & assistance as needed   Disposition Home    Diet: Regular diet, encouraged to drink plenty of fluids,    On your next visit with your primary care physician please Get Medicines reviewed and adjusted.   Please request your Prim.MD to go over all Hospital Tests and Procedure/Radiological results at the follow up, please get all Hospital records sent to your Prim MD by signing hospital release before you go home.   If you experience worsening of your admission symptoms, develop shortness of breath, life threatening emergency, suicidal or homicidal thoughts you must seek medical attention immediately by calling 911 or calling your MD immediately  if symptoms less severe.  You Must read complete instructions/literature along with all the possible adverse reactions/side effects for all the Medicines you take and that have been prescribed to you. Take any new Medicines after you have completely understood and accpet all the possible adverse reactions/side effects.   Do not drive, operating heavy machinery, perform activities at heights, swimming or participation in water activities or provide baby sitting services if your were admitted for syncope or siezures until you have seen by Primary MD or a Neurologist and advised to do so again.  Do not drive when taking Pain medications.    Do not take more than prescribed Pain, Sleep and Anxiety Medications  Special Instructions: If you have smoked or chewed Tobacco  in the last 2 yrs please stop smoking, stop any regular Alcohol  and or any Recreational drug use.  Wear Seat belts while driving.   Please note  You were cared for by a hospitalist during your hospital stay. If you have any questions about your discharge medications or the care you received while you were in the hospital  after you are discharged, you can call the unit and asked to speak with the hospitalist on call if the hospitalist that took care of you is not available. Once you are discharged, your primary care physician will handle any further medical issues. Please note that NO REFILLS for any discharge medications will be authorized once you are discharged, as it is imperative that you return to your primary care physician (or establish a relationship with a primary care physician if you do not have one) for your aftercare needs so that they can reassess your need for medications and monitor your lab values.

## 2016-07-13 NOTE — Progress Notes (Signed)
No prescriptions given, no current medications. Patient advised on stop smoking. Return demonstration/explaination of verbal and written discharge instructions

## 2016-11-06 ENCOUNTER — Encounter (HOSPITAL_COMMUNITY): Payer: Self-pay | Admitting: Emergency Medicine

## 2016-11-06 ENCOUNTER — Ambulatory Visit (HOSPITAL_COMMUNITY)
Admission: EM | Admit: 2016-11-06 | Discharge: 2016-11-06 | Disposition: A | Discharge status: Home / Self Care | Payer: 59 | Attending: Internal Medicine | Admitting: Internal Medicine

## 2016-11-06 DIAGNOSIS — Z113 Encounter for screening for infections with a predominantly sexual mode of transmission: Secondary | ICD-10-CM | POA: Insufficient documentation

## 2016-11-06 NOTE — ED Triage Notes (Signed)
Pt is having no symptoms, he would just like to be checked for STD's.

## 2016-11-06 NOTE — Discharge Instructions (Signed)
Tests for gonorrhea, chlamydia, trichomonas, and syphilis, HIV, hepatitis, were done today. The urgent care will contact you if any of these tests are positive and further treatment is needed. Results will also be available in MyChart.

## 2016-11-07 LAB — URINE CYTOLOGY ANCILLARY ONLY
CHLAMYDIA, DNA PROBE: NEGATIVE
Neisseria Gonorrhea: NEGATIVE
Trichomonas: NEGATIVE

## 2016-11-07 LAB — HIV ANTIBODY (ROUTINE TESTING W REFLEX): HIV SCREEN 4TH GENERATION: NONREACTIVE

## 2016-11-07 LAB — HEPATITIS PANEL, ACUTE
HEP B C IGM: NEGATIVE
HEP B S AG: NEGATIVE
Hep A IgM: NEGATIVE

## 2016-11-08 LAB — RPR: RPR Ser Ql: NONREACTIVE

## 2017-02-02 ENCOUNTER — Telehealth (HOSPITAL_COMMUNITY): Payer: Self-pay | Admitting: Physician Assistant

## 2017-02-02 ENCOUNTER — Encounter (HOSPITAL_COMMUNITY): Payer: Self-pay | Admitting: Family Medicine

## 2017-02-02 ENCOUNTER — Ambulatory Visit (HOSPITAL_COMMUNITY)
Admission: EM | Admit: 2017-02-02 | Discharge: 2017-02-02 | Disposition: A | Discharge status: Home / Self Care | Payer: 59 | Attending: Internal Medicine | Admitting: Internal Medicine

## 2017-02-02 DIAGNOSIS — A749 Chlamydial infection, unspecified: Secondary | ICD-10-CM | POA: Insufficient documentation

## 2017-02-02 DIAGNOSIS — Z202 Contact with and (suspected) exposure to infections with a predominantly sexual mode of transmission: Secondary | ICD-10-CM

## 2017-02-02 DIAGNOSIS — B9689 Other specified bacterial agents as the cause of diseases classified elsewhere: Secondary | ICD-10-CM | POA: Diagnosis not present

## 2017-02-02 MED ORDER — AZITHROMYCIN 250 MG PO TABS
1000.0000 mg | ORAL_TABLET | Freq: Once | ORAL | Status: AC
  Administered 2017-02-02: 1000 mg via ORAL

## 2017-02-02 MED ORDER — STERILE WATER FOR INJECTION IJ SOLN
INTRAMUSCULAR | Status: AC
  Filled 2017-02-02: qty 10

## 2017-02-02 MED ORDER — METRONIDAZOLE 500 MG PO TABS: 2000.0000 mg | ORAL_TABLET | Freq: Once | ORAL | 0 refills | Status: DC

## 2017-02-02 MED ORDER — AZITHROMYCIN 250 MG PO TABS
ORAL_TABLET | ORAL | Status: AC
  Filled 2017-02-02: qty 4

## 2017-02-02 MED ORDER — METRONIDAZOLE 500 MG PO TABS: 2000.0000 mg | ORAL_TABLET | Freq: Once | ORAL | 0 refills | Status: AC

## 2017-02-02 MED ORDER — CEFTRIAXONE SODIUM 250 MG IJ SOLR
250.0000 mg | Freq: Once | INTRAMUSCULAR | Status: AC
  Administered 2017-02-02: 250 mg via INTRAMUSCULAR

## 2017-02-02 MED ORDER — CEFTRIAXONE SODIUM 250 MG IJ SOLR
INTRAMUSCULAR | Status: AC
  Filled 2017-02-02: qty 250

## 2017-02-02 NOTE — Discharge Instructions (Signed)
You were treated empirically for gonorrhea, chlamydia, trichomonas. Azithromycin 1g by mouth and Rocephin 250mg  injection given in office today.  Start Flagyl as directed, refrain from alcohol use for the next 7 days. Cytology sent, you will be contacted with any positive results that requires further treatment. Refrain from sexual activity for the next 7 days. Monitor for any worsening of symptoms, fever, abdominal pain, nausea, vomiting, to follow up for reevaluation.

## 2017-02-02 NOTE — ED Triage Notes (Signed)
Pt here for exposure to chlamydia  

## 2017-02-02 NOTE — ED Provider Notes (Signed)
[facility]    CSN: [id number] Arrival date & time: 02/02/17  1024     History   Chief Complaint Chief Complaint  Patient presents with  . Exposure to STD    HPI Caleb ClayJustin Morrison is a 21 y.o. male.   21 year old male comes in for STD exposure.  States that he was exposed to chlamydia.  He has some dysuria, without hematuria, frequency.  Denies fever, chills, night sweats.  Denies abdominal pain, nausea, vomiting.  Denies penile lesion, pain, swelling, testicular pain, swelling.  He was sexually active with one partner, with a condom use.  Sexual activity about a month ago.      Past Medical History:  Diagnosis Date  . Rhabdomyolysis     Patient Active Problem List   Diagnosis Date Noted  . Acute kidney injury (HCC) 07/08/2016  . Hyperglycemia 07/07/2016  . Acute renal failure (ARF) (HCC) 07/06/2016  . Rhabdomyolysis 06/29/2016  . 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 Leukocytosis 06/28/2016  . Metabolic acidosis 06/28/2016    Past Surgical History:  Procedure Laterality Date  . ELBOW SURGERY         Home Medications    Prior to Admission medications   Medication Sig Start Date End Date Taking? Authorizing Provider  metroNIDAZOLE (FLAGYL) 500 MG tablet Take 4 tablets (2,000 mg total) by mouth once for 1 dose. 02/02/17 02/02/17  Belinda FisherYu, Amy V, PA-C    Family History History reviewed. No pertinent family history.  Social History Social History   Tobacco Use  . Smoking status: Current Some Day Smoker  . Smokeless tobacco: Never Used  Substance Use Topics  . Alcohol use: No  . Drug use: Yes    Types: Marijuana    Comment: last use 4 days ago     Allergies   Patient has no known allergies.   Review of Systems Review of Systems  Reason unable to perform ROS: See HPI as above.     Physical Exam Triage Vital Signs ED Triage Vitals [02/02/17 1110]  Enc Vitals Group     BP 131/72     Pulse Rate 66     Resp 18     Temp  98.6 F (37 C)     Temp src      SpO2 100 %     Weight      Height      Head Circumference      Peak Flow      Pain Score      Pain Loc      Pain Edu?      Excl. in GC?    No data found.  Updated Vital Signs BP 131/72 (BP Location: Left Arm)   Pulse 66   Temp 98.6 F (37 C)   Resp 18   SpO2 100%   Physical Exam  Constitutional: He is oriented to person, place, and time. He appears well-developed and well-nourished. No distress.  HENT:  Head: Normocephalic and atraumatic.  Eyes: Conjunctivae are normal. Pupils are equal, round, and reactive to light.  Neurological: He is alert and oriented to person, place, and time.     UC Treatments / Results  Labs (all labs ordered are listed, but only abnormal results are displayed) Labs Reviewed  URINE CYTOLOGY ANCILLARY ONLY    EKG  EKG Interpretation None       Radiology No results found.  Procedures Procedures (including critical care time)  Medications Ordered in UC Medications  azithromycin (ZITHROMAX) tablet 1,000 mg (1,000 mg Oral Given 02/02/17 1143)  cefTRIAXone (ROCEPHIN) injection 250 mg (250 mg Intramuscular Given 02/02/17 1143)     Initial Impression / Assessment and Plan / UC Course  I have reviewed the triage vital signs and the nursing notes.  Pertinent labs & imaging results that were available during my care of the patient were reviewed by me and considered in my medical decision making (see chart for details).    Patient was treated empirically for gonorrhea, chlamydia, trichomonas. Azithromycin and Rocephin given in office today.  Flagyl as directed.  Cytology sent, patient will be contacted with any positive results that require additional treatment. Patient to refrain from sexual activity for the next 7 days. Return precautions given.    Final Clinical Impressions(s) / UC Diagnoses   Final diagnoses:  STD exposure    ED Discharge Orders        Ordered    metroNIDAZOLE (FLAGYL) 500  MG tablet   Once,   Status:  Discontinued     02/02/17 1131        Belinda FisherYu, Amy V, PA-C 02/02/17 1205

## 2017-02-05 LAB — URINE CYTOLOGY ANCILLARY ONLY
Chlamydia: POSITIVE — AB
Neisseria Gonorrhea: NEGATIVE
Trichomonas: NEGATIVE

## 2017-05-30 ENCOUNTER — Encounter (HOSPITAL_COMMUNITY): Payer: Self-pay | Admitting: Emergency Medicine

## 2017-05-30 ENCOUNTER — Other Ambulatory Visit: Payer: Self-pay

## 2017-05-30 ENCOUNTER — Ambulatory Visit (HOSPITAL_COMMUNITY)
Admission: EM | Admit: 2017-05-30 | Discharge: 2017-05-30 | Disposition: A | Discharge status: Home / Self Care | Payer: 59 | Attending: Family Medicine | Admitting: Family Medicine

## 2017-05-30 DIAGNOSIS — G44209 Tension-type headache, unspecified, not intractable: Secondary | ICD-10-CM | POA: Diagnosis not present

## 2017-05-30 MED ORDER — DICLOFENAC SODIUM 75 MG PO TBEC: 75.0000 mg | DELAYED_RELEASE_TABLET | Freq: Two times a day (BID) | ORAL | 0 refills | Status: DC | PRN

## 2017-05-30 NOTE — ED Triage Notes (Signed)
Pt reports an ache in his forehead that started on Sunday.  He has not been able to relieve it with rest or NSAIDS.

## 2017-06-07 NOTE — ED Provider Notes (Signed)
[facility]   [id number] 05/30/17 Arrival Time: 1353  ASSESSMENT & PLAN:  1. Acute non intractable tension-type headache    Meds ordered this encounter  Medications  . diclofenac (VOLTAREN) 75 MG EC tablet    Sig: Take 1 tablet (75 mg total) by mouth 2 (two) times daily as needed. For headache.    Dispense:  14 tablet    Refill:  0   HA precautions. Will f/u here or the ED if acute worsening.  Reviewed expectations re: course of current medical issues. Questions answered. Outlined signs and symptoms indicating need for more acute intervention. Patient verbalized understanding. After Visit Summary given.   SUBJECTIVE:  Caleb Morrison is a 22 y.o. male who presents with complaint of a headache for a few days. On and off. Gradual onset when present. Can last several hours or most of the day at times. Describes as an ache from his neck to his forehead. No visual changes. No n/v associated. Does not have a h/o migraines. Able to sleep through the night. Normal ambulation. No extremity sensation changes or weakness. Worse the latter part of the day. OTC NSAID without relief. Not the worst HA of his life.  ROS: As per HPI.   OBJECTIVE:  Vitals:   05/30/17 1413  BP: 123/79  Pulse: 84  Temp: 98.4 F (36.9 C)  TempSrc: Oral  SpO2: 96%    General appearance: alert; no distress Eyes: PERRLA; EOMI; conjunctiva normal HENT: normocephalic; atraumatic Neck: supple with FROM; some posterior neck 'soreness' that continues into scalp Lungs: clear to auscultation bilaterally Heart: regular rate and rhythm Extremities: no edema; symmetrical with no gross deformities Skin: warm and dry Neurologic: CN 2-12 grossly intact; normal gait; normal symmetric reflexes; normal extremity strength and sensation throughout Psychological: alert and cooperative; normal mood and affect   No Known Allergies  Past Medical History:  Diagnosis Date  . Rhabdomyolysis    Social History    Socioeconomic History  . Marital status: Single    Spouse name: Not on file  . Number of children: Not on file  . Years of education: Not on file  . Highest education level: Not on file  Occupational History  . Occupation: Landscaper  Social Needs  . Financial resource strain: Not on file  . Food insecurity:    Worry: Not on file    Inability: Not on file  . Transportation needs:    Medical: Not on file    Non-medical: Not on file  Tobacco Use  . Smoking status: Current Some Day Smoker  . Smokeless tobacco: Never Used  Substance and Sexual Activity  . Alcohol use: No  . Drug use: Not Currently    Types: Marijuana    Comment: last use 4 days ago  . Sexual activity: Not on file  Lifestyle  . Physical activity:    Days per week: Not on file    Minutes per session: Not on file  . Stress: Not on file  Relationships  . Social connections:    Talks on phone: Not on file    Gets together: Not on file    Attends religious service: Not on file    Active member of club or organization: Not on file    Attends meetings of clubs or organizations: Not on file    Relationship status: Not on file  . Intimate partner violence:    Fear of current or ex partner: Not on file    Emotionally abused: Not on  file    Physically abused: Not on file    Forced sexual activity: Not on file  Other Topics Concern  . Not on file  Social History Narrative  . Not on file   FH: no h/o headaches.  Past Surgical History:  Procedure Laterality Date  . ELBOW SURGERY       Mardella Layman, MD 06/07/17 [phone]

## 2017-06-08 ENCOUNTER — Other Ambulatory Visit: Payer: Self-pay

## 2017-06-08 ENCOUNTER — Encounter (HOSPITAL_COMMUNITY): Payer: Self-pay | Admitting: Emergency Medicine

## 2017-06-08 ENCOUNTER — Emergency Department (HOSPITAL_COMMUNITY)
Admission: EM | Admit: 2017-06-08 | Discharge: 2017-06-09 | Disposition: A | Discharge status: Home / Self Care | Payer: 59 | Attending: Emergency Medicine | Admitting: Emergency Medicine

## 2017-06-08 ENCOUNTER — Emergency Department (HOSPITAL_COMMUNITY): Payer: 59

## 2017-06-08 DIAGNOSIS — Y929 Unspecified place or not applicable: Secondary | ICD-10-CM | POA: Diagnosis not present

## 2017-06-08 DIAGNOSIS — Y939 Activity, unspecified: Secondary | ICD-10-CM | POA: Insufficient documentation

## 2017-06-08 DIAGNOSIS — S0232XA Fracture of orbital floor, left side, initial encounter for closed fracture: Secondary | ICD-10-CM | POA: Diagnosis not present

## 2017-06-08 DIAGNOSIS — S0990XA Unspecified injury of head, initial encounter: Secondary | ICD-10-CM

## 2017-06-08 DIAGNOSIS — Z23 Encounter for immunization: Secondary | ICD-10-CM | POA: Diagnosis not present

## 2017-06-08 DIAGNOSIS — Y999 Unspecified external cause status: Secondary | ICD-10-CM | POA: Insufficient documentation

## 2017-06-08 DIAGNOSIS — S0500XA Injury of conjunctiva and corneal abrasion without foreign body, unspecified eye, initial encounter: Secondary | ICD-10-CM

## 2017-06-08 DIAGNOSIS — S01112A Laceration without foreign body of left eyelid and periocular area, initial encounter: Secondary | ICD-10-CM | POA: Diagnosis not present

## 2017-06-08 DIAGNOSIS — S0502XA Injury of conjunctiva and corneal abrasion without foreign body, left eye, initial encounter: Secondary | ICD-10-CM | POA: Insufficient documentation

## 2017-06-08 DIAGNOSIS — S0101XA Laceration without foreign body of scalp, initial encounter: Secondary | ICD-10-CM | POA: Diagnosis not present

## 2017-06-08 DIAGNOSIS — F17200 Nicotine dependence, unspecified, uncomplicated: Secondary | ICD-10-CM | POA: Diagnosis not present

## 2017-06-08 DIAGNOSIS — S022XXA Fracture of nasal bones, initial encounter for closed fracture: Secondary | ICD-10-CM | POA: Diagnosis not present

## 2017-06-08 DIAGNOSIS — S0181XA Laceration without foreign body of other part of head, initial encounter: Secondary | ICD-10-CM | POA: Diagnosis not present

## 2017-06-08 LAB — CBC WITH DIFFERENTIAL/PLATELET
Basophils Absolute: 0.1 10*3/uL (ref 0.0–0.1)
Basophils Relative: 1 %
Eosinophils Absolute: 0 10*3/uL (ref 0.0–0.7)
Eosinophils Relative: 0 %
HCT: 46.9 % (ref 39.0–52.0)
Hemoglobin: 15.8 g/dL (ref 13.0–17.0)
Lymphocytes Relative: 35 %
Lymphs Abs: 2.1 10*3/uL (ref 0.7–4.0)
MCH: 32 pg (ref 26.0–34.0)
MCHC: 33.7 g/dL (ref 30.0–36.0)
MCV: 94.9 fL (ref 78.0–100.0)
Monocytes Absolute: 0.4 10*3/uL (ref 0.1–1.0)
Monocytes Relative: 6 %
Neutro Abs: 3.6 10*3/uL (ref 1.7–7.7)
Neutrophils Relative %: 58 %
Platelets: 286 10*3/uL (ref 150–400)
RBC: 4.94 MIL/uL (ref 4.22–5.81)
RDW: 12.7 % (ref 11.5–15.5)
WBC: 6.2 10*3/uL (ref 4.0–10.5)

## 2017-06-08 MED ORDER — TETANUS-DIPHTH-ACELL PERTUSSIS 5-2.5-18.5 LF-MCG/0.5 IM SUSP
0.5000 mL | Freq: Once | INTRAMUSCULAR | Status: AC
  Administered 2017-06-09: 0.5 mL via INTRAMUSCULAR
  Filled 2017-06-08: qty 0.5

## 2017-06-08 MED ORDER — SODIUM CHLORIDE 0.9 % IV BOLUS
1000.0000 mL | Freq: Once | INTRAVENOUS | Status: AC
  Administered 2017-06-08: 1000 mL via INTRAVENOUS

## 2017-06-08 MED ORDER — LIDOCAINE-EPINEPHRINE 1 %-1:100000 IJ SOLN
10.0000 mL | Freq: Once | INTRAMUSCULAR | Status: AC
  Administered 2017-06-09: 10 mL
  Filled 2017-06-08: qty 10
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
## 2017-06-08 NOTE — ED Notes (Signed)
Suture cart at bedside 

## 2017-06-08 NOTE — ED Notes (Signed)
CT called to make aware pt is ready for tranport
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
## 2017-06-09 LAB — BASIC METABOLIC PANEL
Anion gap: 13 (ref 5–15)
BUN: 12 mg/dL (ref 6–20)
CO2: 21 mmol/L — ABNORMAL LOW (ref 22–32)
Calcium: 8.7 mg/dL — ABNORMAL LOW (ref 8.9–10.3)
Chloride: 106 mmol/L (ref 101–111)
Creatinine, Ser: 1.51 mg/dL — ABNORMAL HIGH (ref 0.61–1.24)
GFR calc Af Amer: >60 mL/min (ref >60)
GFR calc non Af Amer: >60 mL/min (ref >60)
Glucose, Bld: 92 mg/dL (ref 65–99)
Potassium: 3.8 mmol/L (ref 3.5–5.1)
Sodium: 140 mmol/L (ref 135–145)

## 2017-06-09 LAB — ETHANOL: Alcohol, Ethyl (B): 243 mg/dL — ABNORMAL HIGH (ref <10)

## 2017-06-09 MED ORDER — AMOXICILLIN 500 MG PO CAPS
500.0000 mg | ORAL_CAPSULE | Freq: Once | ORAL | Status: AC
  Administered 2017-06-09: 500 mg via ORAL
  Filled 2017-06-09: qty 1

## 2017-06-09 MED ORDER — HYDROCODONE-ACETAMINOPHEN 5-325 MG PO TABS: 2.0000 | ORAL_TABLET | Freq: Four times a day (QID) | ORAL | 0 refills | Status: DC | PRN

## 2017-06-09 MED ORDER — AMOXICILLIN 500 MG PO CAPS: 500.0000 mg | ORAL_CAPSULE | Freq: Three times a day (TID) | ORAL | 0 refills | Status: DC

## 2017-06-09 MED ORDER — ERYTHROMYCIN 5 MG/GM OP OINT: TOPICAL_OINTMENT | OPHTHALMIC | 0 refills | Status: DC

## 2017-06-09 MED ORDER — FLUORESCEIN SODIUM 1 MG OP STRP
1.0000 | ORAL_STRIP | Freq: Once | OPHTHALMIC | Status: AC
  Administered 2017-06-09: 1 via OPHTHALMIC
  Filled 2017-06-09: qty 1

## 2017-06-09 MED ORDER — TETRACAINE HCL 0.5 % OP SOLN
2.0000 [drp] | Freq: Once | OPHTHALMIC | Status: AC
  Administered 2017-06-09: 2 [drp] via OPHTHALMIC
  Filled 2017-06-09: qty 4

## 2017-06-09 MED ORDER — IBUPROFEN 800 MG PO TABS: 800.0000 mg | ORAL_TABLET | Freq: Three times a day (TID) | ORAL | 0 refills | Status: DC | PRN

## 2017-06-09 NOTE — ED Notes (Signed)
Pt alert and oriented x4. IDA NT cleaned pt with soap and water body and face. Placed pt in paper scrubs.
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
## 2017-06-09 NOTE — ED Provider Notes (Signed)
..Laceration Repair Date/Time: 06/09/2017 7:41 AM Performed by: Jeanie Sewer, PA-C Authorized by: Jeanie Sewer, PA-C   Consent:    Consent obtained:  Verbal   Consent given by:  Patient   Risks discussed:  Infection, pain, poor cosmetic result and poor wound healing Anesthesia (see MAR for exact dosages):    Anesthesia method:  Local infiltration   Local anesthetic:  Lidocaine 1% WITH epi Laceration details:    Location:  Face   Face location:  L eyebrow   Length (cm):  4   Depth (mm):  2 Repair type:    Repair type:  Simple Pre-procedure details:    Preparation:  Patient was prepped and draped in usual sterile fashion and imaging obtained to evaluate for foreign bodies Exploration:    Hemostasis achieved with:  Direct pressure   Wound exploration: wound explored through full range of motion and entire depth of wound probed and visualized     Wound extent: areolar tissue violated     Contaminated: yes   Treatment:    Area cleansed with:  Betadine and saline   Amount of cleaning:  Extensive   Irrigation solution:  Sterile saline   Irrigation method:  Syringe   Visualized foreign bodies/material removed: no   Skin repair:    Repair method:  Sutures   Suture size:  5-0   Suture material:  Prolene   Suture technique:  Simple interrupted   Number of sutures:  4 Approximation:    Approximation:  Close Post-procedure details:    Dressing:  Antibiotic ointment   Patient tolerance of procedure:  Tolerated well, no immediate complications .Marland KitchenLaceration Repair Date/Time: 06/09/2017 7:42 AM Performed by: Jeanie Sewer, PA-C Authorized by: Jeanie Sewer, PA-C   Consent:    Consent obtained:  Verbal   Consent given by:  Patient   Risks discussed:  Poor cosmetic result, pain and infection Anesthesia (see MAR for exact dosages):    Anesthesia method:  Local infiltration   Local anesthetic:  Lidocaine 1% WITH epi Laceration details:    Location:  Face   Face location:  Forehead  (between the eyebrows)   Length (cm):  2   Depth (mm):  2 Repair type:    Repair type:  Simple Pre-procedure details:    Preparation:  Patient was prepped and draped in usual sterile fashion and imaging obtained to evaluate for foreign bodies Exploration:    Hemostasis achieved with:  Direct pressure   Wound exploration: wound explored through full range of motion and entire depth of wound probed and visualized     Wound extent: areolar tissue violated     Contaminated: yes   Treatment:    Area cleansed with:  Betadine and saline   Amount of cleaning:  Extensive   Irrigation solution:  Sterile saline   Irrigation method:  Pressure wash   Visualized foreign bodies/material removed: no   Skin repair:    Repair method:  Sutures   Suture size:  5-0   Suture material:  Prolene   Suture technique:  Simple interrupted   Number of sutures:  2 Approximation:    Approximation:  Close Post-procedure details:    Dressing:  Open (no dressing)   Patient tolerance of procedure:  Tolerated well, no immediate complications .Marland KitchenLaceration Repair Date/Time: 06/09/2017 7:45 AM Performed by: Jeanie Sewer, PA-C Authorized by: Jeanie Sewer, PA-C   Consent:    Consent obtained:  Verbal   Consent given by:  Patient  Risks discussed:  Infection, pain and poor cosmetic result Laceration details:    Location:  Scalp   Scalp location:  L temporal   Length (cm):  1   Depth (mm):  2 Repair type:    Repair type:  Simple Pre-procedure details:    Preparation:  Patient was prepped and draped in usual sterile fashion and imaging obtained to evaluate for foreign bodies Exploration:    Hemostasis achieved with:  Direct pressure   Wound exploration: wound explored through full range of motion and entire depth of wound probed and visualized     Wound extent: areolar tissue violated     Contaminated: yes   Treatment:    Area cleansed with:  Betadine and saline   Amount of cleaning:  Extensive    Irrigation solution:  Sterile saline   Irrigation method:  Pressure wash   Visualized foreign bodies/material removed: no   Skin repair:    Repair method:  Sutures   Suture size:  5-0   Suture material:  Prolene   Suture technique:  Simple interrupted   Number of sutures:  1 Approximation:    Approximation:  Close Post-procedure details:    Dressing:  Antibiotic ointment   Patient tolerance of procedure:  Tolerated well, no immediate complications .Marland KitchenLaceration Repair Date/Time: 06/09/2017 7:46 AM Performed by: Jeanie Sewer, PA-C Authorized by: Jeanie Sewer, PA-C   Consent:    Consent obtained:  Verbal   Consent given by:  Patient   Risks discussed:  Infection, pain and poor cosmetic result Anesthesia (see MAR for exact dosages):    Anesthesia method:  Local infiltration   Local anesthetic:  Lidocaine 1% WITH epi Laceration details:    Location:  Scalp   Scalp location:  Occipital   Length (cm):  1   Depth (mm):  3 Repair type:    Repair type:  Simple Pre-procedure details:    Preparation:  Patient was prepped and draped in usual sterile fashion and imaging obtained to evaluate for foreign bodies Exploration:    Hemostasis achieved with:  Direct pressure   Wound exploration: wound explored through full range of motion and entire depth of wound probed and visualized     Wound extent: areolar tissue violated     Contaminated: yes   Treatment:    Area cleansed with:  Betadine and saline   Amount of cleaning:  Extensive   Irrigation solution:  Sterile saline   Irrigation method:  Syringe   Visualized foreign bodies/material removed: no   Skin repair:    Repair method:  Sutures   Suture size:  5-0   Suture material:  Prolene   Suture technique:  Simple interrupted   Number of sutures:  1 Approximation:    Approximation:  Close Post-procedure details:    Dressing:  Antibiotic ointment   Patient tolerance of procedure:  Tolerated well, no immediate  complications      Jeanie Sewer, PA-C 06/09/17 1610

## 2017-06-15 ENCOUNTER — Encounter: Payer: Self-pay | Admitting: Emergency Medicine

## 2017-06-15 ENCOUNTER — Other Ambulatory Visit: Payer: Self-pay

## 2017-06-15 ENCOUNTER — Ambulatory Visit
Admission: EM | Admit: 2017-06-15 | Discharge: 2017-06-15 | Disposition: A | Discharge status: Home / Self Care | Payer: 59 | Attending: Family Medicine | Admitting: Family Medicine

## 2017-06-15 DIAGNOSIS — Z4802 Encounter for removal of sutures: Secondary | ICD-10-CM
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
## 2017-06-15 NOTE — Discharge Instructions (Signed)
Be sure to follow up as indicated.  Normal care and bathing.  Take care  Dr. Adriana Simas

## 2017-09-06 ENCOUNTER — Other Ambulatory Visit: Payer: Self-pay

## 2017-09-06 ENCOUNTER — Ambulatory Visit (HOSPITAL_COMMUNITY)
Admission: EM | Admit: 2017-09-06 | Discharge: 2017-09-06 | Disposition: A | Discharge status: Home / Self Care | Payer: 59 | Attending: Emergency Medicine | Admitting: Emergency Medicine

## 2017-09-06 ENCOUNTER — Encounter (HOSPITAL_COMMUNITY): Payer: Self-pay | Admitting: *Deleted

## 2017-09-06 DIAGNOSIS — Z202 Contact with and (suspected) exposure to infections with a predominantly sexual mode of transmission: Secondary | ICD-10-CM | POA: Diagnosis not present

## 2017-09-06 DIAGNOSIS — Z113 Encounter for screening for infections with a predominantly sexual mode of transmission: Secondary | ICD-10-CM

## 2017-09-06 DIAGNOSIS — R109 Unspecified abdominal pain: Secondary | ICD-10-CM | POA: Diagnosis present

## 2017-09-06 DIAGNOSIS — R300 Dysuria: Secondary | ICD-10-CM | POA: Insufficient documentation

## 2017-09-06 DIAGNOSIS — F17210 Nicotine dependence, cigarettes, uncomplicated: Secondary | ICD-10-CM | POA: Insufficient documentation

## 2017-09-06 MED ORDER — CEFTRIAXONE SODIUM 250 MG IJ SOLR
250.0000 mg | Freq: Once | INTRAMUSCULAR | Status: AC
  Administered 2017-09-06: 250 mg via INTRAMUSCULAR

## 2017-09-06 MED ORDER — CEFTRIAXONE SODIUM 250 MG IJ SOLR
INTRAMUSCULAR | Status: AC
  Filled 2017-09-06: qty 250

## 2017-09-06 MED ORDER — AZITHROMYCIN 250 MG PO TABS
ORAL_TABLET | ORAL | Status: AC
  Filled 2017-09-06: qty 4

## 2017-09-06 MED ORDER — AZITHROMYCIN 250 MG PO TABS
1000.0000 mg | ORAL_TABLET | Freq: Once | ORAL | Status: AC
  Administered 2017-09-06: 1000 mg via ORAL

## 2017-09-06 NOTE — Discharge Instructions (Signed)
We have treated you today for gonorrhea and chlamydia, with rocephin and azithromycin. Please refrain from sexual activity for 7 days while medicine is clearing infection. ° °We are testing you for Gonorrhea, Chlamydia and Trichomonas. We will call you if anything is positive and let you know if you require any further treatment. Please inform partner of any positive results. ° °Please return if symptoms not improving with treatment, development of fever, nausea, vomiting, abdominal pain, scrotal pain. °

## 2017-09-06 NOTE — ED Provider Notes (Signed)
[facility]    CSN: [id number] Arrival date & time: 09/06/17  1041     History   Chief Complaint Chief Complaint  Patient presents with  . Exposure to STD    HPI Caleb Morrison is a 22 y.o. male history of CKD presenting today for evaluation of dysuria and STD screening.  Patient states that he had unprotected sex approximately 3 days ago.  Since he has developed dysuria, testicle tingling as well as occasional abdominal cramping.  He denies any penile discharge.  Denies any scrotal swelling/testicular tenderness.  Denies any rashes or lesions.  States that his partner said that she believes she tested positive for chlamydia.  HPI  Past Medical History:  Diagnosis Date  . Rhabdomyolysis     Patient Active Problem List   Diagnosis Date Noted  . Acute kidney injury (HCC) 07/08/2016  . Hyperglycemia 07/07/2016  . Acute renal failure (ARF) (HCC) 07/06/2016  . Rhabdomyolysis 06/29/2016  . 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 Leukocytosis 06/28/2016  . Metabolic acidosis 06/28/2016    Past Surgical History:  Procedure Laterality Date  . ELBOW SURGERY         Home Medications    Prior to Admission medications   Medication Sig Start Date End Date Taking? Authorizing Provider  amoxicillin (AMOXIL) 500 MG capsule Take 1 capsule (500 mg total) by mouth 3 (three) times daily. 06/09/17   Ward, Layla MawKristen N, DO  diclofenac (VOLTAREN) 75 MG EC tablet Take 1 tablet (75 mg total) by mouth 2 (two) times daily as needed. For headache. 05/30/17   Mardella LaymanHagler, Brian, MD  erythromycin ophthalmic ointment Place a 1/2 inch ribbon of ointment into the lower eyelid 4 times a day for 5 days 06/09/17   Ward, Layla MawKristen N, DO  HYDROcodone-acetaminophen (NORCO/VICODIN) 5-325 MG tablet Take 2 tablets by mouth every 6 (six) hours as needed. 06/09/17   Ward, Layla MawKristen N, DO  ibuprofen (ADVIL,MOTRIN) 800 MG tablet Take 1 tablet (800 mg total) by mouth every 8 (eight) hours as needed for mild  pain. 06/09/17   Ward, Layla MawKristen N, DO    Family History Family History  Problem Relation Age of Onset  . Diabetes Mother   . Healthy Father     Social History Social History   Tobacco Use  . Smoking status: Current Some Day Smoker    Packs/day: 0.25    Types: Cigarettes  . Smokeless tobacco: Never Used  Substance Use Topics  . Alcohol use: Yes  . Drug use: Not Currently    Types: Marijuana    Comment: last use 4 days ago     Allergies   Patient has no known allergies.   Review of Systems Review of Systems  Constitutional: Negative for fever.  HENT: Negative for sore throat.   Respiratory: Negative for shortness of breath.   Cardiovascular: Negative for chest pain.  Gastrointestinal: Negative for abdominal pain, nausea and vomiting.  Genitourinary: Positive for dysuria. Negative for difficulty urinating, discharge, frequency, penile pain, penile swelling, scrotal swelling and testicular pain.  Skin: Negative for rash.  Neurological: Negative for dizziness, light-headedness and headaches.     Physical Exam Triage Vital Signs ED Triage Vitals  Enc Vitals Group     BP 09/06/17 1104 (!) 157/83     Pulse --      Resp 09/06/17 1104 14     Temp 09/06/17 1104 98.8 F (37.1 C)     Temp Source 09/06/17 1104 Oral  SpO2 09/06/17 1104 98 %     Weight --      Height --      Head Circumference --      Peak Flow --      Pain Score 09/06/17 1105 7     Pain Loc --      Pain Edu? --      Excl. in GC? --    No data found.  Updated Vital Signs BP (!) 157/83 (BP Location: Right Arm)   Temp 98.8 F (37.1 C) (Oral)   Resp 14   SpO2 98%   Visual Acuity Right Eye Distance:   Left Eye Distance:   Bilateral Distance:    Right Eye Near:   Left Eye Near:    Bilateral Near:     Physical Exam  Constitutional: He is oriented to person, place, and time. He appears well-developed and well-nourished.  No acute distress  HENT:  Head: Normocephalic and atraumatic.    Nose: Nose normal.  Eyes: Conjunctivae are normal.  Neck: Neck supple.  Cardiovascular: Normal rate.  Pulmonary/Chest: Effort normal. No respiratory distress.  Abdominal: He exhibits no distension.  Nontender to light deep palpation throughout all 4 quadrants  Musculoskeletal: Normal range of motion.  Neurological: He is alert and oriented to person, place, and time.  Skin: Skin is warm and dry.  Psychiatric: He has a normal mood and affect.  Nursing note and vitals reviewed.    UC Treatments / Results  Labs (all labs ordered are listed, but only abnormal results are displayed) Labs Reviewed  URINE CYTOLOGY ANCILLARY ONLY    EKG None  Radiology No results found.  Procedures Procedures (including critical care time)  Medications Ordered in UC Medications  cefTRIAXone (ROCEPHIN) injection 250 mg (has no administration in time range)  azithromycin (ZITHROMAX) tablet 1,000 mg (has no administration in time range)    Initial Impression / Assessment and Plan / UC Course  I have reviewed the triage vital signs and the nursing notes.  Pertinent labs & imaging results that were available during my care of the patient were reviewed by me and considered in my medical decision making (see chart for details).     Patient with dysuria, likely STD, urine cytology obtained, will empirically treat with Rocephin and azithromycin.  Will call patient with results to inform of any positive results as well as alter treatment as needed.  Discussed refraining from intercourse for 7 days, informing partners.Discussed strict return precautions. Patient verbalized understanding and is agreeable with plan.  Final Clinical Impressions(s) / UC Diagnoses   Final diagnoses:  Screen for STD (sexually transmitted disease)  Dysuria     Discharge Instructions     We have treated you today for gonorrhea and chlamydia, with rocephin and azithromycin. Please refrain from sexual activity for 7 days  while medicine is clearing infection.  We are testing you for Gonorrhea, Chlamydia and Trichomonas. We will call you if anything is positive and let you know if you require any further treatment. Please inform partner of any positive results.  Please return if symptoms not improving with treatment, development of fever, nausea, vomiting, abdominal pain, scrotal pain.   ED Prescriptions    None     Controlled Substance Prescriptions [place] Controlled Substance Registry consulted? Not Applicable   Lew Dawes, [place] 09/06/17 1125

## 2017-09-06 NOTE — ED Triage Notes (Signed)
Wants to be checked for STD c/o burning with urination.

## 2017-09-07 LAB — URINE CYTOLOGY ANCILLARY ONLY
Chlamydia: NEGATIVE
Neisseria Gonorrhea: NEGATIVE
Trichomonas: NEGATIVE

## 2018-01-11 ENCOUNTER — Encounter (HOSPITAL_COMMUNITY): Payer: Self-pay | Admitting: Emergency Medicine

## 2018-01-11 ENCOUNTER — Ambulatory Visit (HOSPITAL_COMMUNITY)
Admission: EM | Admit: 2018-01-11 | Discharge: 2018-01-11 | Disposition: A | Discharge status: Home / Self Care | Payer: 59 | Attending: Family Medicine | Admitting: Family Medicine

## 2018-01-11 DIAGNOSIS — F17210 Nicotine dependence, cigarettes, uncomplicated: Secondary | ICD-10-CM | POA: Diagnosis not present

## 2018-01-11 DIAGNOSIS — Z113 Encounter for screening for infections with a predominantly sexual mode of transmission: Secondary | ICD-10-CM | POA: Diagnosis not present

## 2018-01-11 DIAGNOSIS — Z202 Contact with and (suspected) exposure to infections with a predominantly sexual mode of transmission: Secondary | ICD-10-CM | POA: Insufficient documentation

## 2018-01-11 DIAGNOSIS — Z833 Family history of diabetes mellitus: Secondary | ICD-10-CM | POA: Insufficient documentation

## 2018-01-11 DIAGNOSIS — R300 Dysuria: Secondary | ICD-10-CM

## 2018-01-11 MED ORDER — CEFTRIAXONE SODIUM 250 MG IJ SOLR
INTRAMUSCULAR | Status: AC
Start: 2018-01-11 — End: 2018-01-11
  Filled 2018-01-11: qty 250

## 2018-01-11 MED ORDER — CEFTRIAXONE SODIUM 250 MG IJ SOLR
250.0000 mg | Freq: Once | INTRAMUSCULAR | Status: AC
  Administered 2018-01-11: 250 mg via INTRAMUSCULAR

## 2018-01-11 MED ORDER — AZITHROMYCIN 250 MG PO TABS
ORAL_TABLET | ORAL | Status: AC
  Filled 2018-01-11: qty 4

## 2018-01-11 MED ORDER — AZITHROMYCIN 250 MG PO TABS
1000.0000 mg | ORAL_TABLET | Freq: Once | ORAL | Status: AC
  Administered 2018-01-11: 1000 mg via ORAL

## 2018-01-11 MED ORDER — LIDOCAINE HCL 2 % IJ SOLN
INTRAMUSCULAR | Status: AC
  Filled 2018-01-11: qty 20

## 2018-01-11 NOTE — ED Triage Notes (Signed)
Pt here for exposure to chlamydia; pt sts some penile discharge

## 2018-01-11 NOTE — Discharge Instructions (Signed)
Given rocephin 250mg  injection and azithromycin 1g in office Urine cytology obtained  HIV/ syphilis testing today We will follow up with you regarding the results of your test If tests are positive, please abstain from sexual activity for at least 7 days and notify partners Follow up with PCP if symptoms persists Return here or go to ER if you have any new or worsening symptoms

## 2018-01-11 NOTE — ED Provider Notes (Signed)
[facility]   [id number] 01/11/18 Arrival Time: 1125   [id number] and STD exposure  SUBJECTIVE:  Caleb Morrison is a 22 y.o. male who presents requesting STI screening.  Complains of dysuria and possible exposure to chlamydia.  Last unprotected sexual encounter 4-5 days ago.  Sexually active with 1 male partner.  Reports similar symptoms in the past.  Denies fever, chills, nausea, vomiting, abdominal or pelvic pain, penile rashes or lesions, testicular swelling or pain.     No LMP for male patient.  ROS: As per HPI.  Past Medical History:  Diagnosis Date  . Rhabdomyolysis    Past Surgical History:  Procedure Laterality Date  . ELBOW SURGERY     No Known Allergies No current facility-administered medications on file prior to encounter.    Current Outpatient Medications on File Prior to Encounter  Medication Sig Dispense Refill  . ibuprofen (ADVIL,MOTRIN) 800 MG tablet Take 1 tablet (800 mg total) by mouth every 8 (eight) hours as needed for mild pain. 30 tablet 0   Social History   Socioeconomic History  . Marital status: Single    Spouse name: Not on file  . Number of children: Not on file  . Years of education: Not on file  . Highest education level: Not on file  Occupational History  . Occupation: Landscaper  Social Needs  . Financial resource strain: Not on file  . Food insecurity:    Worry: Not on file    Inability: Not on file  . Transportation needs:    Medical: Not on file    Non-medical: Not on file  Tobacco Use  . Smoking status: Current Some Day Smoker    Packs/day: 0.25    Types: Cigarettes  . Smokeless tobacco: Never Used  Substance and Sexual Activity  . Alcohol use: Yes  . Drug use: Not Currently    Types: Marijuana    Comment: last use 4 days ago  . Sexual activity: Not on file  Lifestyle  . Physical activity:    Days per week: Not on file    Minutes per session: Not on file  . Stress: Not on file  Relationships  . Social  connections:    Talks on phone: Not on file    Gets together: Not on file    Attends religious service: Not on file    Active member of club or organization: Not on file    Attends meetings of clubs or organizations: Not on file    Relationship status: Not on file  . Intimate partner violence:    Fear of current or ex partner: Not on file    Emotionally abused: Not on file    Physically abused: Not on file    Forced sexual activity: Not on file  Other Topics Concern  . Not on file  Social History Narrative  . Not on file   Family History  Problem Relation Age of Onset  . Diabetes Mother   . Healthy Father     OBJECTIVE:  Vitals:   01/11/18 1159  BP: 134/77  Pulse: 72  Resp: 18  Temp: 98.9 F (37.2 C)  TempSrc: Oral  SpO2: 99%     General appearance: alert, NAD, appears stated age Head: NCAT Throat: lips, mucosa, and tongue normal; teeth and gums normal Lungs: CTA bilaterally without adventitious breath sounds Heart: regular rate and rhythm.  Radial pulses 2+ symmetrical bilaterally Abdomen: soft, non-tender; bowel sounds normal; no guarding GU: deferred Skin: warm and  dry Psychological:  Alert and cooperative. Normal mood and affect.  LABS:  Results for orders placed or performed during the hospital encounter of 09/06/17  Urine cytology ancillary only  Result Value Ref Range   Chlamydia Negative    Neisseria gonorrhea Negative    Trichomonas Negative     Labs Reviewed  HIV ANTIBODY (ROUTINE TESTING W REFLEX)  RPR  URINE CYTOLOGY ANCILLARY ONLY    ASSESSMENT & PLAN:  1. STD exposure   2. Dysuria     Meds ordered this encounter  Medications  . cefTRIAXone (ROCEPHIN) injection 250 mg    Order Specific Question:   Antibiotic Indication:    Answer:   STD  . azithromycin (ZITHROMAX) tablet 1,000 mg    Pending: Labs Reviewed  HIV ANTIBODY (ROUTINE TESTING W REFLEX)  RPR  URINE CYTOLOGY ANCILLARY ONLY    Given rocephin 250mg  injection and  azithromycin 1g in office Urine cytology obtained  HIV/ syphilis testing today We will follow up with you regarding the results of your test If tests are positive, please abstain from sexual activity for at least 7 days and notify partners Follow up with PCP if symptoms persists Return here or go to ER if you have any new or worsening symptoms    Reviewed expectations re: course of current medical issues. Questions answered. Outlined signs and symptoms indicating need for more acute intervention. Patient verbalized understanding. After Visit Summary given.       Rennis HardingWurst, Floreine Kingdon, PA-C 01/11/18 1217

## 2018-01-12 LAB — RPR: RPR: NONREACTIVE

## 2018-01-12 LAB — HIV ANTIBODY (ROUTINE TESTING W REFLEX): HIV Screen 4th Generation wRfx: NONREACTIVE

## 2018-01-14 LAB — URINE CYTOLOGY ANCILLARY ONLY
CHLAMYDIA, DNA PROBE: NEGATIVE
Neisseria Gonorrhea: NEGATIVE
Trichomonas: NEGATIVE

## 2018-01-31 ENCOUNTER — Other Ambulatory Visit: Payer: Self-pay

## 2018-01-31 ENCOUNTER — Encounter (HOSPITAL_BASED_OUTPATIENT_CLINIC_OR_DEPARTMENT_OTHER): Payer: Self-pay | Admitting: Emergency Medicine

## 2018-01-31 ENCOUNTER — Emergency Department (HOSPITAL_BASED_OUTPATIENT_CLINIC_OR_DEPARTMENT_OTHER)
Admission: EM | Admit: 2018-01-31 | Discharge: 2018-02-01 | Disposition: A | Discharge status: Home / Self Care | Payer: 59 | Attending: Emergency Medicine | Admitting: Emergency Medicine

## 2018-01-31 DIAGNOSIS — R944 Abnormal results of kidney function studies: Secondary | ICD-10-CM | POA: Insufficient documentation

## 2018-01-31 DIAGNOSIS — R748 Abnormal levels of other serum enzymes: Secondary | ICD-10-CM

## 2018-01-31 DIAGNOSIS — N182 Chronic kidney disease, stage 2 (mild): Secondary | ICD-10-CM | POA: Diagnosis not present

## 2018-01-31 DIAGNOSIS — M62838 Other muscle spasm: Secondary | ICD-10-CM | POA: Diagnosis not present

## 2018-01-31 DIAGNOSIS — F17210 Nicotine dependence, cigarettes, uncomplicated: Secondary | ICD-10-CM | POA: Insufficient documentation

## 2018-01-31 DIAGNOSIS — M791 Myalgia, unspecified site: Secondary | ICD-10-CM | POA: Diagnosis present

## 2018-01-31 DIAGNOSIS — R7989 Other specified abnormal findings of blood chemistry: Secondary | ICD-10-CM

## 2018-01-31 LAB — URINALYSIS, ROUTINE W REFLEX MICROSCOPIC
Bilirubin Urine: NEGATIVE
Glucose, UA: NEGATIVE mg/dL
Hgb urine dipstick: NEGATIVE
Ketones, ur: NEGATIVE mg/dL
Leukocytes, UA: NEGATIVE
Nitrite: NEGATIVE
Protein, ur: NEGATIVE mg/dL
Specific Gravity, Urine: 1.025 (ref 1.005–1.030)
pH: 6 (ref 5.0–8.0)

## 2018-01-31 LAB — CBC WITH DIFFERENTIAL/PLATELET
Abs Immature Granulocytes: 0.03 10*3/uL (ref 0.00–0.07)
Basophils Absolute: 0.1 10*3/uL (ref 0.0–0.1)
Basophils Relative: 1 %
Eosinophils Absolute: 0.2 10*3/uL (ref 0.0–0.5)
Eosinophils Relative: 3 %
HCT: 42.3 % (ref 39.0–52.0)
Hemoglobin: 13.5 g/dL (ref 13.0–17.0)
Immature Granulocytes: 1 %
Lymphocytes Relative: 12 %
Lymphs Abs: 0.8 10*3/uL (ref 0.7–4.0)
MCH: 31 pg (ref 26.0–34.0)
MCHC: 31.9 g/dL (ref 30.0–36.0)
MCV: 97 fL (ref 80.0–100.0)
Monocytes Absolute: 1.2 10*3/uL — ABNORMAL HIGH (ref 0.1–1.0)
Monocytes Relative: 18 %
Neutro Abs: 4.2 10*3/uL (ref 1.7–7.7)
Neutrophils Relative %: 65 %
Platelets: 238 10*3/uL (ref 150–400)
RBC: 4.36 MIL/uL (ref 4.22–5.81)
RDW: 12 % (ref 11.5–15.5)
WBC: 6.4 10*3/uL (ref 4.0–10.5)
nRBC: 0 % (ref 0.0–0.2)

## 2018-01-31 LAB — COMPREHENSIVE METABOLIC PANEL
ALT: 17 U/L (ref 0–44)
AST: 27 U/L (ref 15–41)
Albumin: 3.9 g/dL (ref 3.5–5.0)
Alkaline Phosphatase: 49 U/L (ref 38–126)
Anion gap: 6 (ref 5–15)
BUN: 11 mg/dL (ref 6–20)
CO2: 25 mmol/L (ref 22–32)
Calcium: 8.4 mg/dL — ABNORMAL LOW (ref 8.9–10.3)
Chloride: 102 mmol/L (ref 98–111)
Creatinine, Ser: 1.42 mg/dL — ABNORMAL HIGH (ref 0.61–1.24)
GFR calc Af Amer: >60 mL/min (ref >60)
GFR calc non Af Amer: >60 mL/min (ref >60)
Glucose, Bld: 107 mg/dL — ABNORMAL HIGH (ref 70–99)
Potassium: 3.5 mmol/L (ref 3.5–5.1)
Sodium: 133 mmol/L — ABNORMAL LOW (ref 135–145)
Total Bilirubin: 0.6 mg/dL (ref 0.3–1.2)
Total Protein: 6.7 g/dL (ref 6.5–8.1)

## 2018-01-31 LAB — CK: Total CK: 565 U/L — ABNORMAL HIGH (ref 49–397)

## 2018-01-31 MED ORDER — SODIUM CHLORIDE 0.9 % IV BOLUS
1000.0000 mL | Freq: Once | INTRAVENOUS | Status: AC
  Administered 2018-01-31: 1000 mL via INTRAVENOUS

## 2018-01-31 NOTE — ED Notes (Signed)
Pt received 500ml NS by EMS 

## 2018-01-31 NOTE — ED Provider Notes (Signed)
[facility] DEPARTMENT Provider Note   CSN: [id number] Arrival date & time: 01/31/18  2117     History   Chief Complaint Chief Complaint  Patient presents with  . Leg Pain    HPI Caleb Morrison is a 22 y.o. male with history of CKD, rhabdomyolysis presents for evaluation of acute onset, resolved episode of "whole body spasming ".  He reports that at around 6 PM while he was a passenger on his way to work he felt as though he could not move his muscles.  He notes that when he stepped out of the vehicle he felt as though "everything from my eyelids down to my toes could not move ".  He states "it felt like a really bad charley horse ".  Symptoms lasted approximately 15 minutes and resolved when EMS arrived and placed an IV and gave IV fluids.  He reports that he has had similar symptoms in the past but they were more severe and lasted a longer amount of time and required hospitalization for almost a week.  He states "it happens when I sweat a lot and get dehydrated ".  He does note that over the last 3 days he has had a nonproductive cough, subjective fevers and chills, and some diaphoresis throughout the day.  Denies shortness of breath, chest pain, sore throat, difficulty swallowing, numbness, tingling, vision changes, slurred speech, or facial droop. Denies significant increase in his activity levels/workout schedule.  Denies seizure-like activity or history of seizures/epilepsy.  He states he "could be better about drinking water ". States he feels completely back to baseline. No recent injury.   The history is provided by the patient.    Past Medical History:  Diagnosis Date  . Rhabdomyolysis     Patient Active Problem List   Diagnosis Date Noted  . Acute kidney injury (HCC) 07/08/2016  . Hyperglycemia 07/07/2016  . Acute renal failure (ARF) (HCC) 07/06/2016  . Rhabdomyolysis 06/29/2016  . 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 Leukocytosis  06/28/2016  . Metabolic acidosis 06/28/2016    Past Surgical History:  Procedure Laterality Date  . ELBOW SURGERY          Home Medications    Prior to Admission medications   Medication Sig Start Date End Date Taking? Authorizing Provider  ibuprofen (ADVIL,MOTRIN) 800 MG tablet Take 1 tablet (800 mg total) by mouth every 8 (eight) hours as needed for mild pain. 06/09/17   Ward, Layla MawKristen N, DO    Family History Family History  Problem Relation Age of Onset  . Diabetes Mother   . Healthy Father     Social History Social History   Tobacco Use  . Smoking status: Current Some Day Smoker    Packs/day: 0.25    Types: Cigarettes  . Smokeless tobacco: Never Used  Substance Use Topics  . Alcohol use: Yes  . Drug use: Not Currently    Types: Marijuana    Comment: last use 4 days ago     Allergies   Patient has no known allergies.   Review of Systems Review of Systems  Constitutional: Negative for diaphoresis.  HENT: Positive for congestion. Negative for sore throat.   Eyes: Negative for visual disturbance.  Respiratory: Positive for cough. Negative for shortness of breath.   Cardiovascular: Negative for chest pain.  Gastrointestinal: Negative for abdominal pain, nausea and vomiting.  Musculoskeletal: Positive for myalgias.  Neurological: Negative for syncope, facial asymmetry, weakness and numbness.  All  other systems reviewed and are negative.    Physical Exam Updated Vital Signs BP (!) 151/87 (BP Location: Left Arm)   Pulse 92   Temp 99.2 F (37.3 C) (Oral)   Resp 18   Ht 6\' 2"  (1.88 m)   Wt 104.3 kg   SpO2 100%   BMI 29.53 kg/m   Physical Exam Vitals signs and nursing note reviewed.  Constitutional:      General: He is not in acute distress.    Appearance: He is well-developed and normal weight. He is not ill-appearing, toxic-appearing or diaphoretic.  HENT:     Head: Normocephalic and atraumatic.  Eyes:     General:        Right eye: No  discharge.        Left eye: No discharge.     Conjunctiva/sclera: Conjunctivae normal.  Neck:     Musculoskeletal: Normal range of motion and neck supple. No neck rigidity.     Vascular: No JVD.     Trachea: No tracheal deviation.  Cardiovascular:     Rate and Rhythm: Normal rate.     Pulses: Normal pulses.     Heart sounds: Normal heart sounds.  Pulmonary:     Effort: Pulmonary effort is normal.     Breath sounds: Normal breath sounds.  Chest:     Chest wall: No tenderness.  Abdominal:     General: Abdomen is flat. Bowel sounds are normal. There is no distension.     Tenderness: There is no abdominal tenderness. There is no guarding.  Musculoskeletal: Normal range of motion.        General: No swelling, tenderness or deformity.     Comments: No midline spine TTP, no paraspinal muscle tenderness, no deformity, crepitus, or step-off noted. 5/5 strength of BUE and BLE major musckle groups  Skin:    General: Skin is warm and dry.     Findings: No erythema.  Neurological:     General: No focal deficit present.     Mental Status: He is alert and oriented to person, place, and time.     Cranial Nerves: No cranial nerve deficit.     Sensory: No sensory deficit.     Motor: No weakness.     Coordination: Coordination normal.     Gait: Gait normal.     Comments: Mental Status:  Alert, thought content appropriate, able to give a coherent history. Speech fluent without evidence of aphasia. Able to follow 2 step commands without difficulty.  Cranial Nerves:  II:  Peripheral visual fields grossly normal, pupils equal, round, reactive to light III,IV, VI: ptosis not present, extra-ocular motions intact bilaterally  V,VII: smile symmetric, facial light touch sensation equal VIII: hearing grossly normal to voice  X: uvula elevates symmetrically  XI: bilateral shoulder shrug symmetric and strong XII: midline tongue extension without fassiculations Motor:  Normal tone. 5/5 strength of BUE  and BLE major muscle groups including strong and equal grip strength and dorsiflexion/plantar flexion, no pronator drift Sensory: light touch normal in all extremities. Cerebellar: normal finger-to-nose with bilateral upper extremities, Romberg sign absent Gait: normal gait and balance. Able to walk on toes and heels with ease.  CV: 2+ radial and DP/PT pulses   Psychiatric:        Behavior: Behavior normal.      ED Treatments / Results  Labs (all labs ordered are listed, but only abnormal results are displayed) Labs Reviewed  CBC WITH DIFFERENTIAL/PLATELET - Abnormal; Notable for the  following components:      Result Value   Monocytes Absolute 1.2 (*)    All other components within normal limits  COMPREHENSIVE METABOLIC PANEL - Abnormal; Notable for the following components:   Sodium 133 (*)    Glucose, Bld 107 (*)    Creatinine, Ser 1.42 (*)    Calcium 8.4 (*)    All other components within normal limits  CK - Abnormal; Notable for the following components:   Total CK 565 (*)    All other components within normal limits  URINALYSIS, ROUTINE W REFLEX MICROSCOPIC    EKG None  Radiology No results found.  Procedures Procedures (including critical care time)  Medications Ordered in ED Medications  sodium chloride 0.9 % bolus 1,000 mL (0 mLs Intravenous Stopped 01/31/18 2352)     Initial Impression / Assessment and Plan / ED Course  I have reviewed the triage vital signs and the nursing notes.  Pertinent labs & imaging results that were available during my care of the patient were reviewed by me and considered in my medical decision making (see chart for details).     Patient presenting for evaluation of generalized muscle cramping and spasm.  Has had similar episodes in the past which required hospitalization for what sounds like CKD and rhabdomyolysis.  He is afebrile, vital signs are stable.  He is completely back to baseline on my assessment, no focal neurologic  deficits and normal neuro exam.  No evidence of CVA, doubt seizures.  No fever or meningeal signs to suggest meningitis.  He has some URI symptoms but lungs are clear to auscultation and I doubt pneumonia.  Lab work reviewed by me shows no leukocytosis, no anemia, creatinine at patient's baseline.  BUN within normal limits.  UA unremarkable with no evidence of hemoglobin or bilirubin.  His total CK is mildly elevated at 565 but not nearly as high as prior lab values which required hospitalization.  He was given another liter of IV fluids in the ED.  On reevaluation he is resting comfortably no apparent distress.  Endorses mild lower extremity muscle soreness but feels comfortable with discharge home.  Encouraged p.o. fluid rehydration and follow-up with PCP for reevaluation of creatinine and CK.  Discussed red ED return precautions. Pt verbalized understanding of and agreement with plan and is safe for discharge home at this time.   Final Clinical Impressions(s) / ED Diagnoses   Final diagnoses:  Muscle spasm  Elevated creatine kinase  Elevated serum creatinine    ED Discharge Orders    None       Bennye AlmFawze, Feliciana Narayan A, PA-C 01/31/18 2353    Raeford RazorKohut, Stephen, MD 02/01/18 0006

## 2018-01-31 NOTE — Discharge Instructions (Signed)
Your lab work is consistent with dehydration and muscle damage.  Make sure to drink plenty of water and get plenty of rest.  You should drink at least eight 8oz glasses of water a day. You can take hot showers or hot baths and do some gentle stretching to avoid muscle stiffness and soreness.  You can also apply heating pad to areas of soreness specifically.  Follow-up with a primary care provider for reevaluation of your abnormal kidney function abnormal creatine kinase levels.  You can call the number on the back of your insurance card or set up an account on the insurance website and call doctors offices in your area and set up an appointment.  Return to the emergency department if any concerning signs or symptoms develop such as fevers, weakness, difficulty breathing or swallowing, chest pain, or passing out.

## 2018-01-31 NOTE — ED Triage Notes (Signed)
Pt c/o leg cramping since 1900. Pt reports URI symptoms with decreased appetite.

## 2018-03-08 IMAGING — US US RENAL
1 series · 14 of 25 positions shown · non-contrast
Comparison: None.

CLINICAL DATA: 20 y/o  M; acute kidney injury.

EXAM:
RENAL / URINARY TRACT ULTRASOUND COMPLETE

[Series 1: us renal · 0.23mm/px · 14 of 35 slices shown]
[im 1/35]
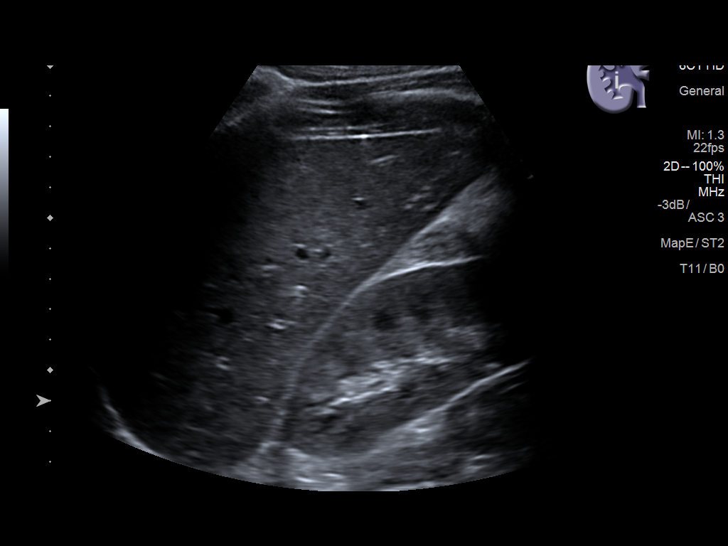
[im 3/35]
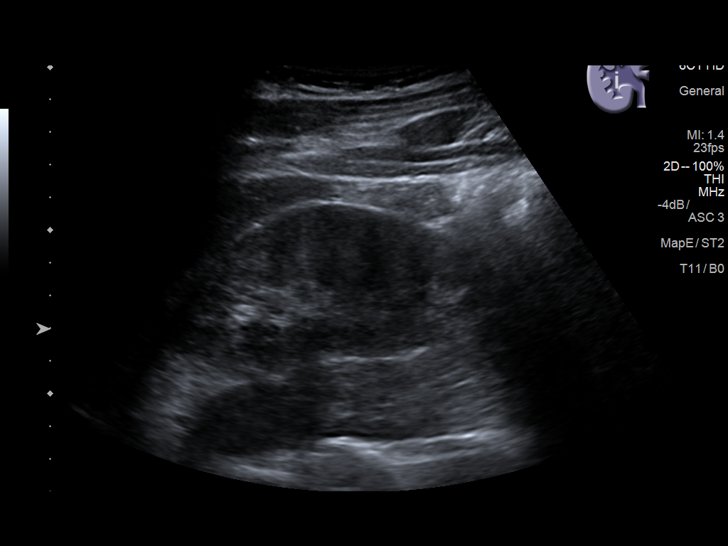
[im 6/35]
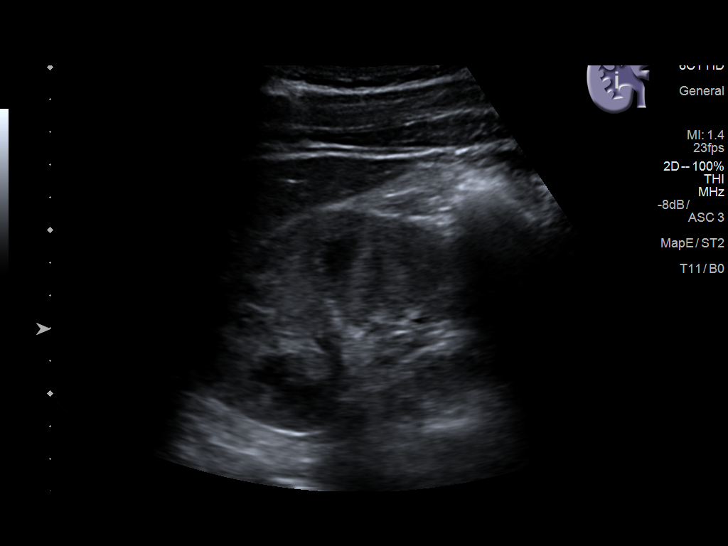
[im 9/35]
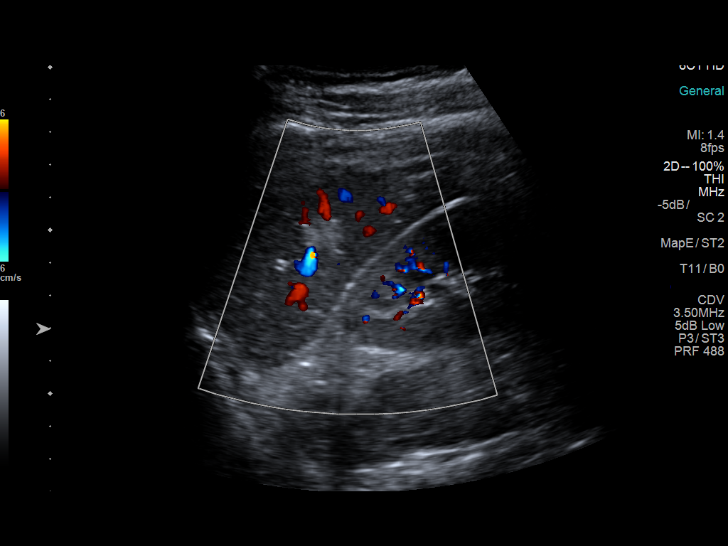
[im 12/35]
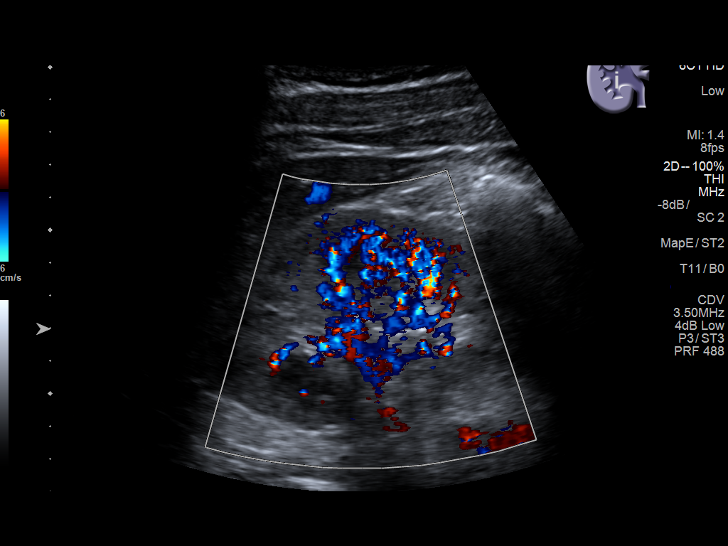
[im 13/35]
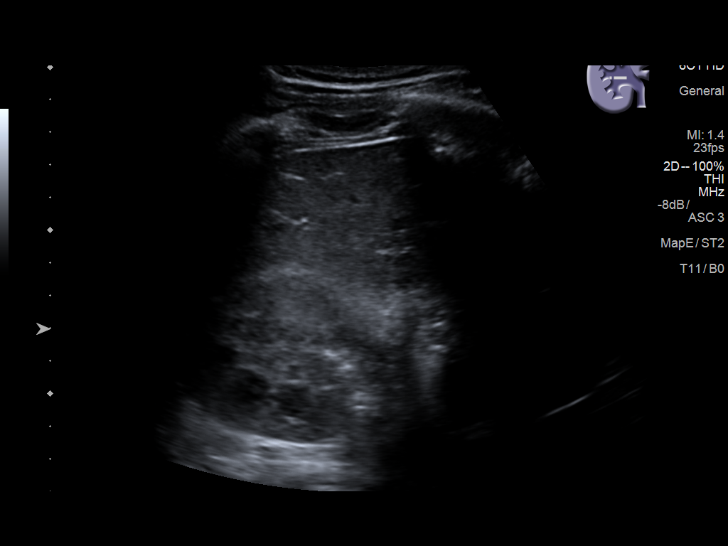
[im 16/35]
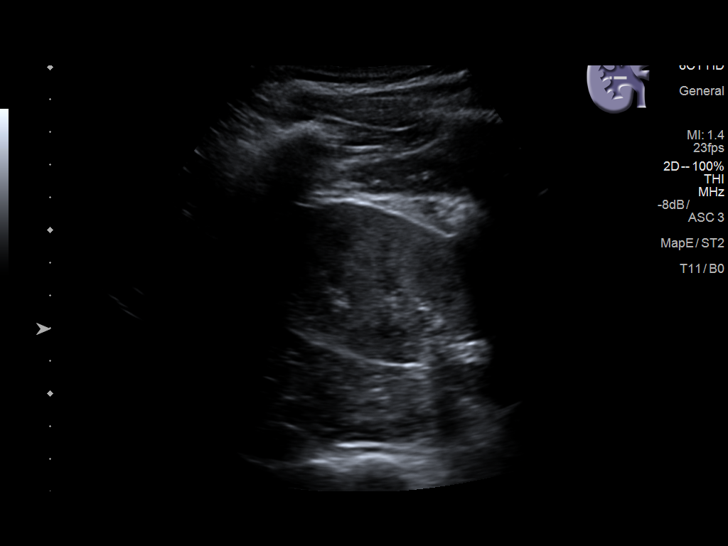
[im 19/35]
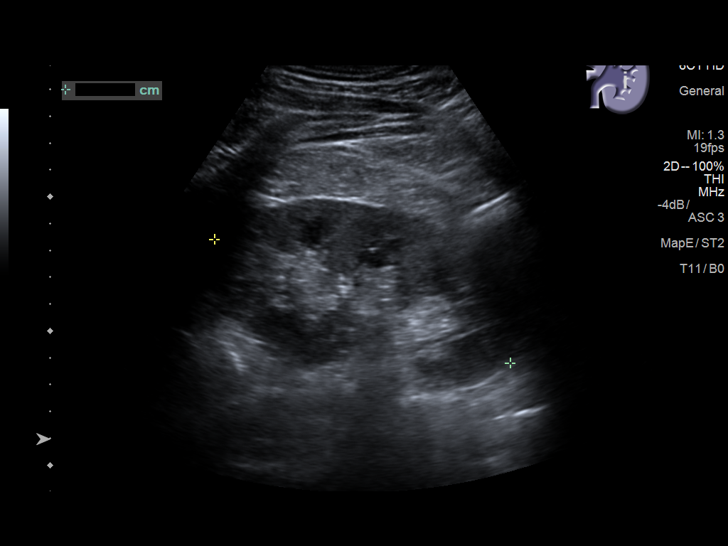
[im 22/35]
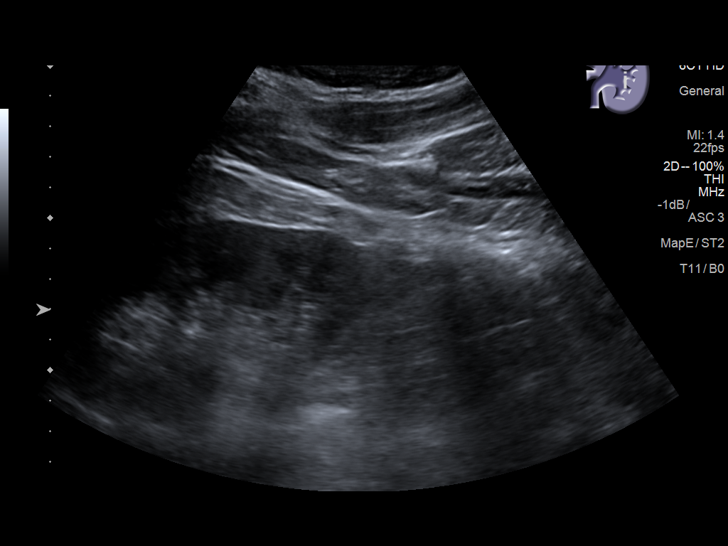
[im 23/35]
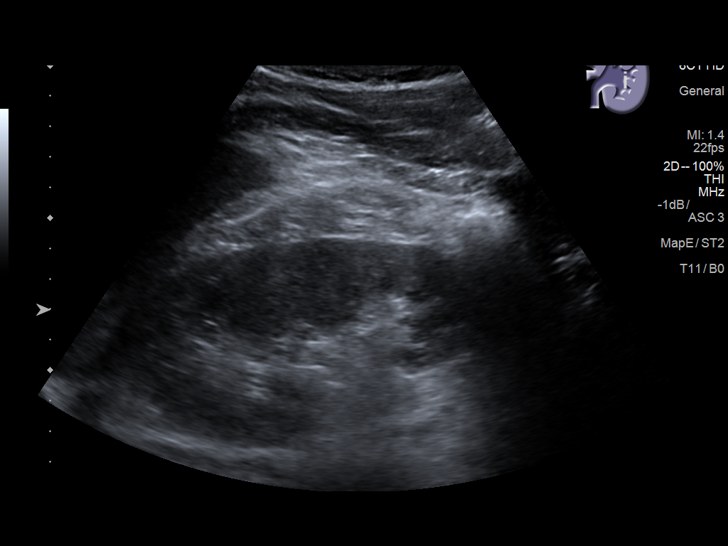
[im 26/35]
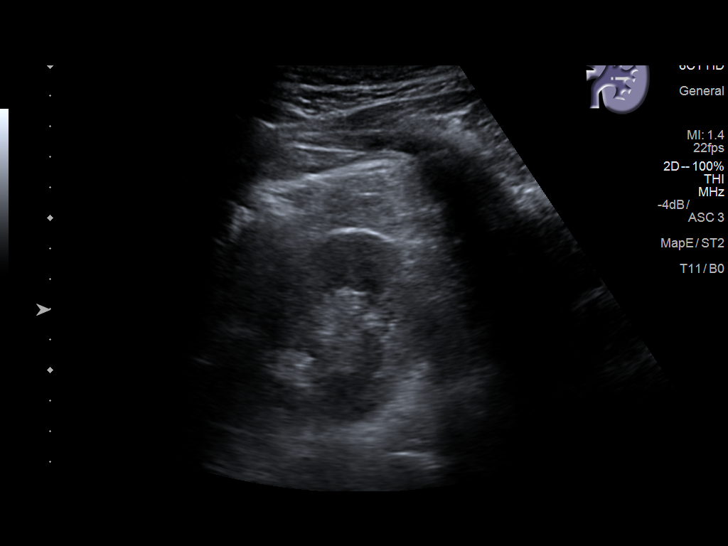
[im 29/35]
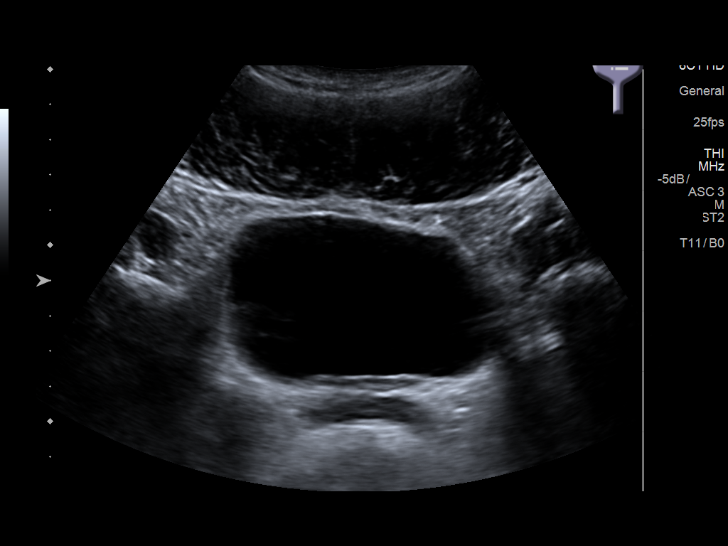
[im 32/35]
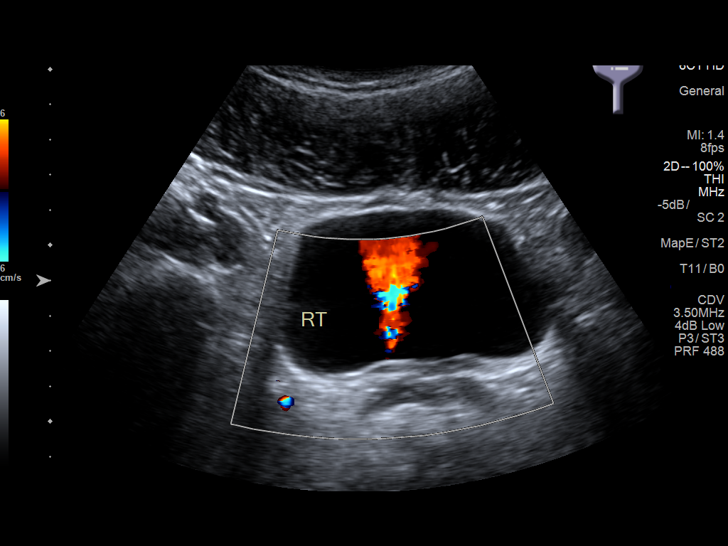
[im 35/35]
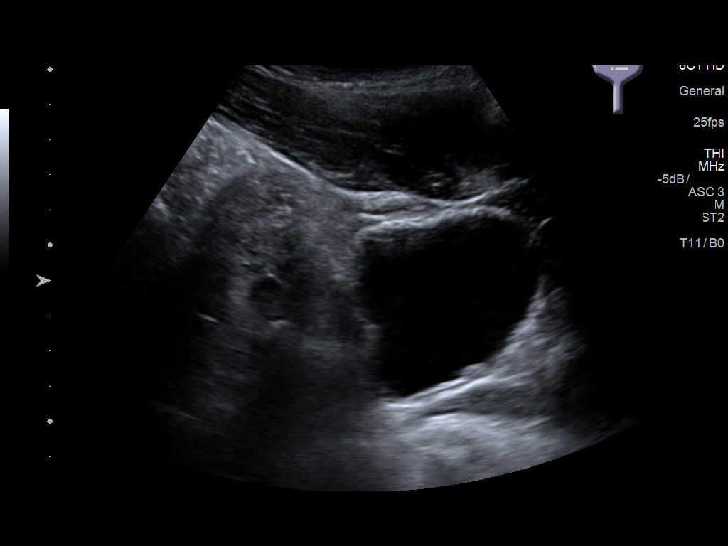

[14 of 25 positions shown; findings below may reference images not displayed]

FINDINGS: Right Kidney:

Length: 11.5 cm. Echogenicity within normal limits. No mass or
hydronephrosis visualized.

Left Kidney:

Length: 12.0 cm. Echogenicity within normal limits. No mass or
hydronephrosis visualized.

Bladder:

Appears normal for degree of bladder distention. Bilateral ureteral
jets visualized.

Other:  1.5 cm echogenic focus in the right lobe of liver.
IMPRESSION: 1. Unremarkable appearance of the kidneys.  No hydronephrosis.
2. 1.5 cm echogenic focus in right lobe of liver, this is probably a
hemangioma. Consider follow-up with ultrasound to ensure stability.

By: Es Tiger M.D.

## 2018-07-26 ENCOUNTER — Ambulatory Visit (HOSPITAL_COMMUNITY)
Admission: EM | Admit: 2018-07-26 | Discharge: 2018-07-26 | Disposition: A | Discharge status: Home / Self Care | Payer: 59 | Attending: Internal Medicine | Admitting: Internal Medicine

## 2018-07-26 ENCOUNTER — Other Ambulatory Visit: Payer: Self-pay

## 2018-07-26 ENCOUNTER — Encounter (HOSPITAL_COMMUNITY): Payer: Self-pay

## 2018-07-26 DIAGNOSIS — R300 Dysuria: Secondary | ICD-10-CM

## 2018-07-26 DIAGNOSIS — Z202 Contact with and (suspected) exposure to infections with a predominantly sexual mode of transmission: Secondary | ICD-10-CM | POA: Diagnosis present

## 2018-07-26 DIAGNOSIS — N50819 Testicular pain, unspecified: Secondary | ICD-10-CM | POA: Diagnosis not present

## 2018-07-26 DIAGNOSIS — Z113 Encounter for screening for infections with a predominantly sexual mode of transmission: Secondary | ICD-10-CM | POA: Diagnosis not present

## 2018-07-26 MED ORDER — AZITHROMYCIN 250 MG PO TABS
ORAL_TABLET | ORAL | Status: AC
Start: 2018-07-26 — End: ?
  Filled 2018-07-26: qty 4

## 2018-07-26 MED ORDER — AZITHROMYCIN 250 MG PO TABS
1000.0000 mg | ORAL_TABLET | Freq: Once | ORAL | Status: AC
  Administered 2018-07-26: 1000 mg via ORAL

## 2018-07-26 NOTE — ED Triage Notes (Signed)
Pt presents to get tested and treated for STD after known exposure; pt states he has burning when urinating and testicular discomfort.
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
## 2018-07-29 LAB — URINE CYTOLOGY ANCILLARY ONLY
Chlamydia: NEGATIVE
Neisseria Gonorrhea: NEGATIVE
Trichomonas: NEGATIVE

## 2019-05-22 ENCOUNTER — Ambulatory Visit: Payer: 59

## 2019-07-13 ENCOUNTER — Encounter (HOSPITAL_COMMUNITY): Payer: Self-pay

## 2019-07-13 ENCOUNTER — Ambulatory Visit (HOSPITAL_COMMUNITY)
Admission: EM | Admit: 2019-07-13 | Discharge: 2019-07-13 | Disposition: A | Discharge status: Home / Self Care | Payer: 59 | Attending: Urgent Care | Admitting: Urgent Care

## 2019-07-13 ENCOUNTER — Other Ambulatory Visit: Payer: Self-pay

## 2019-07-13 DIAGNOSIS — N50812 Left testicular pain: Secondary | ICD-10-CM | POA: Insufficient documentation

## 2019-07-13 DIAGNOSIS — N50811 Right testicular pain: Secondary | ICD-10-CM | POA: Insufficient documentation

## 2019-07-13 DIAGNOSIS — Z202 Contact with and (suspected) exposure to infections with a predominantly sexual mode of transmission: Secondary | ICD-10-CM | POA: Diagnosis present

## 2019-07-13 MED ORDER — AZITHROMYCIN 250 MG PO TABS
ORAL_TABLET | ORAL | Status: AC
  Filled 2019-07-13: qty 4

## 2019-07-13 MED ORDER — AZITHROMYCIN 250 MG PO TABS
1000.0000 mg | ORAL_TABLET | Freq: Once | ORAL | Status: AC
  Administered 2019-07-13: 1000 mg via ORAL

## 2019-07-13 NOTE — ED Triage Notes (Signed)
Pt presents to UC for STD's testing. Pt reports his girlfriend tested positive for Chlamydia. Pt denies any symptoms.

## 2019-07-13 NOTE — Discharge Instructions (Signed)
Avoid all forms of sexual intercourse (oral, vaginal, anal) for the next 7 days to avoid spreading/reinfecting. Return if symptoms worsen/do not resolve, you develop fever, abdominal pain, blood in your urine, or are re-exposed to an STI.  

## 2019-07-13 NOTE — ED Provider Notes (Signed)
  [facility]   MRN: [id number] DOB: Jul 01, 1995  Subjective:   Caleb Morrison is a 24 y.o. male presenting for exposure to chlamydia.  Has cough and has a positive enlargement about this.  He has had intermittent occasional testicular pain.  No other symptoms.  Had HIV and syphilis testing about a year ago and does not want this again today.  No current facility-administered medications for this encounter.  Current Outpatient Medications:  .  ibuprofen (ADVIL,MOTRIN) 800 MG tablet, Take 1 tablet (800 mg total) by mouth every 8 (eight) hours as needed for mild pain., Disp: 30 tablet, Rfl: 0   No Known Allergies  Past Medical History:  Diagnosis Date  . Rhabdomyolysis      Past Surgical History:  Procedure Laterality Date  . ELBOW SURGERY      Family History  Problem Relation Age of Onset  . Diabetes Mother   . Healthy Father     Social History   Tobacco Use  . Smoking status: Current Some Day Smoker    Packs/day: 0.25    Types: Cigarettes  . Smokeless tobacco: Never Used  Substance Use Topics  . Alcohol use: Yes  . Drug use: Not Currently    Types: Marijuana    Comment: last use 4 days ago    ROS   Objective:   Vitals: BP 116/67 (BP Location: Left Arm)   Pulse 77   Temp 97.7 F (36.5 C) (Oral)   Resp 18   SpO2 97%   Physical Exam Constitutional:      General: He is not in acute distress.    Appearance: Normal appearance. He is well-developed and normal weight. He is not ill-appearing, toxic-appearing or diaphoretic.  HENT:     Head: Normocephalic and atraumatic.     Right Ear: External ear normal.     Left Ear: External ear normal.     Nose: Nose normal.     Mouth/Throat:     Pharynx: Oropharynx is clear.  Eyes:     General: No scleral icterus.       Right eye: No discharge.        Left eye: No discharge.     Extraocular Movements: Extraocular movements intact.     Pupils: Pupils are equal, round, and reactive to light.  Cardiovascular:      Rate and Rhythm: Normal rate.  Pulmonary:     Effort: Pulmonary effort is normal.  Musculoskeletal:     Cervical back: Normal range of motion.  Neurological:     Mental Status: He is alert and oriented to person, place, and time.  Psychiatric:        Mood and Affect: Mood normal.        Behavior: Behavior normal.        Thought Content: Thought content normal.        Judgment: Judgment normal.      Assessment and Plan :   PDMP not reviewed this encounter.  1. STD exposure   2. Pain in both testicles   3. Exposure to chlamydia     Patient does not want doxycycline as an outpatient.  As such to assure compliance, patient is being given azithromycin 1 g in clinic. Counseled patient on potential for adverse effects with medications prescribed/recommended today, ER and return-to-clinic precautions discussed, patient verbalized understanding.    Wallis Bamberg, PA-C 07/13/19 1147

## 2019-07-14 LAB — CYTOLOGY, (ORAL, ANAL, URETHRAL) ANCILLARY ONLY
Chlamydia: NEGATIVE
Comment: NEGATIVE
Comment: NEGATIVE
Comment: NORMAL
Neisseria Gonorrhea: NEGATIVE
Trichomonas: NEGATIVE
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
## 2019-11-08 ENCOUNTER — Encounter (HOSPITAL_COMMUNITY): Payer: Self-pay | Admitting: *Deleted

## 2019-11-08 ENCOUNTER — Other Ambulatory Visit: Payer: Self-pay

## 2019-11-08 ENCOUNTER — Ambulatory Visit (HOSPITAL_COMMUNITY)
Admission: EM | Admit: 2019-11-08 | Discharge: 2019-11-08 | Disposition: A | Discharge status: Home / Self Care | Payer: 59 | Attending: Physician Assistant | Admitting: Physician Assistant

## 2019-11-08 DIAGNOSIS — N50819 Testicular pain, unspecified: Secondary | ICD-10-CM | POA: Diagnosis not present

## 2019-11-08 DIAGNOSIS — Z202 Contact with and (suspected) exposure to infections with a predominantly sexual mode of transmission: Secondary | ICD-10-CM | POA: Diagnosis present

## 2019-11-08 LAB — POCT URINALYSIS DIPSTICK, ED / UC
Bilirubin Urine: NEGATIVE
Glucose, UA: NEGATIVE mg/dL
Hgb urine dipstick: NEGATIVE
Ketones, ur: NEGATIVE mg/dL
Leukocytes,Ua: NEGATIVE
Nitrite: NEGATIVE
Protein, ur: NEGATIVE mg/dL
Specific Gravity, Urine: >=1.03 (ref 1.005–1.030)
Urobilinogen, UA: 0.2 mg/dL (ref 0.0–1.0)
pH: 5.5 (ref 5.0–8.0)

## 2019-11-08 MED ORDER — LIDOCAINE HCL (PF) 1 % IJ SOLN
INTRAMUSCULAR | Status: AC
  Filled 2019-11-08: qty 2

## 2019-11-08 MED ORDER — AZITHROMYCIN 250 MG PO TABS
ORAL_TABLET | ORAL | Status: AC
  Filled 2019-11-08: qty 4

## 2019-11-08 MED ORDER — CEFTRIAXONE SODIUM 500 MG IJ SOLR
INTRAMUSCULAR | Status: AC
  Filled 2019-11-08: qty 500

## 2019-11-08 MED ORDER — CEFTRIAXONE SODIUM 500 MG IJ SOLR
500.0000 mg | Freq: Once | INTRAMUSCULAR | Status: AC
  Administered 2019-11-08: 500 mg via INTRAMUSCULAR

## 2019-11-08 MED ORDER — AZITHROMYCIN 250 MG PO TABS
1000.0000 mg | ORAL_TABLET | Freq: Once | ORAL | Status: AC
  Administered 2019-11-08: 1000 mg via ORAL

## 2019-11-08 NOTE — Discharge Instructions (Signed)
No sex for 1 week

## 2019-11-08 NOTE — ED Triage Notes (Signed)
C/o bilat testicular pain x 2 days without swelling or penile discharge.  C/O slight "tingling" when urinating.  Denies hematuria.

## 2019-11-08 NOTE — ED Provider Notes (Signed)
[facility]    CSN: [id number] Arrival date & time: 11/08/19  1044      History   Chief Complaint Chief Complaint  Patient presents with  . Testicle Pain    HPI Caleb Morrison is a 24 y.o. male.   Patient here c/w testicular pain x today.  Sexually active 1 male partner w/o condom use who told him she tested positive for CT.  He notes mild "tingle" when he urinates.  Denies f/c, n/v, abdominal pain, flank pain, testicular swelling, urgency, frequency, hematuria.  He states he will not be compliant with outpatient doxycycline therapy, requesting azithromycin instead.     Past Medical History:  Diagnosis Date  . Rhabdomyolysis     Patient Active Problem List   Diagnosis Date Noted  . Acute kidney injury (HCC) 07/08/2016  . Hyperglycemia 07/07/2016  . Acute renal failure (ARF) (HCC) 07/06/2016  . Rhabdomyolysis 06/29/2016  . 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 Leukocytosis 06/28/2016  . Metabolic acidosis 06/28/2016    Past Surgical History:  Procedure Laterality Date  . ELBOW SURGERY         Home Medications    Prior to Admission medications   Medication Sig Start Date End Date Taking? Authorizing Provider  ibuprofen (ADVIL,MOTRIN) 800 MG tablet Take 1 tablet (800 mg total) by mouth every 8 (eight) hours as needed for mild pain. 06/09/17   Ward, Layla Maw, DO    Family History Family History  Problem Relation Age of Onset  . Diabetes Mother   . Healthy Father     Social History Social History   Tobacco Use  . Smoking status: Current Some Day Smoker    Packs/day: 0.25    Types: Cigarettes  . Smokeless tobacco: Never Used  Vaping Use  . Vaping Use: Never used  Substance Use Topics  . Alcohol use: Yes    Comment: occasionally  . Drug use: Yes    Types: Marijuana     Allergies   Patient has no known allergies.   Review of Systems Review of Systems  Constitutional: Negative for chills, fatigue and fever.  Eyes:  Negative for pain and visual disturbance.  Respiratory: Negative for cough and shortness of breath.   Cardiovascular: Negative for chest pain and palpitations.  Gastrointestinal: Negative for abdominal pain, nausea and vomiting.  Genitourinary: Positive for dysuria and testicular pain. Negative for difficulty urinating, hematuria, penile pain, penile swelling, scrotal swelling and urgency.  Musculoskeletal: Negative for arthralgias, back pain and myalgias.  Skin: Negative for color change and rash.  Neurological: Negative for seizures and syncope.  Hematological: Negative for adenopathy. Does not bruise/bleed easily.  Psychiatric/Behavioral: Negative for sleep disturbance.  All other systems reviewed and are negative.    Physical Exam Triage Vital Signs ED Triage Vitals  Enc Vitals Group     BP 11/08/19 1221 (!) 147/88     Pulse Rate 11/08/19 1221 70     Resp 11/08/19 1221 14     Temp 11/08/19 1221 98.9 F (37.2 C)     Temp Source 11/08/19 1221 Oral     SpO2 11/08/19 1221 98 %     Weight --      Height --      Head Circumference --      Peak Flow --      Pain Score 11/08/19 1222 5     Pain Loc --      Pain Edu? --      Excl.  in GC? --    No data found.  Updated Vital Signs BP (!) 147/88   Pulse 70   Temp 98.9 F (37.2 C) (Oral)   Resp 14   SpO2 98%   Visual Acuity Right Eye Distance:   Left Eye Distance:   Bilateral Distance:    Right Eye Near:   Left Eye Near:    Bilateral Near:     Physical Exam Vitals and nursing note reviewed.  Constitutional:      Appearance: He is well-developed.  HENT:     Head: Normocephalic and atraumatic.  Eyes:     Extraocular Movements: Extraocular movements intact.     Conjunctiva/sclera: Conjunctivae normal.  Cardiovascular:     Rate and Rhythm: Normal rate and regular rhythm.     Heart sounds: No murmur heard.   Pulmonary:     Effort: Pulmonary effort is normal. No respiratory distress.     Breath sounds: Normal  breath sounds.  Abdominal:     Palpations: Abdomen is soft.     Tenderness: There is no abdominal tenderness. There is no right CVA tenderness, left CVA tenderness or guarding.     Hernia: There is no hernia in the left inguinal area or right inguinal area.  Genitourinary:    Testes: Normal. Cremasteric reflex is present.        Right: Mass, tenderness or swelling not present.        Left: Mass, tenderness or swelling not present.     Epididymis:     Right: Not inflamed or enlarged.     Left: Not inflamed or enlarged.  Musculoskeletal:        General: Normal range of motion.     Cervical back: Normal range of motion and neck supple. No rigidity.  Skin:    General: Skin is warm and dry.     Capillary Refill: Capillary refill takes less than 2 seconds.  Neurological:     General: No focal deficit present.     Mental Status: He is alert.     Motor: No weakness.     Gait: Gait normal.  Psychiatric:        Mood and Affect: Mood normal.        Behavior: Behavior normal.      UC Treatments / Results  Labs (all labs ordered are listed, but only abnormal results are displayed) Labs Reviewed  POCT URINALYSIS DIPSTICK, ED / UC  URINE CYTOLOGY ANCILLARY ONLY    EKG   Radiology No results found.  Procedures Procedures (including critical care time)  Medications Ordered in UC Medications  cefTRIAXone (ROCEPHIN) injection 500 mg (has no administration in time range)  azithromycin (ZITHROMAX) tablet 1,000 mg (has no administration in time range)    Initial Impression / Assessment and Plan / UC Course  I have reviewed the triage vital signs and the nursing notes.  Pertinent labs & imaging results that were available during my care of the patient were reviewed by me and considered in my medical decision making (see chart for details).     Will treat empirically for STDs Will provide azithromycin to increase compliance No sex for 1 week Use condoms  Final Clinical  Impressions(s) / UC Diagnoses   Final diagnoses:  None   Discharge Instructions   None    ED Prescriptions    None     PDMP not reviewed this encounter.   Evern Core, PA-C 11/08/19 1247

## 2019-11-10 LAB — URINE CYTOLOGY ANCILLARY ONLY
Chlamydia: NEGATIVE
Comment: NEGATIVE
Comment: NEGATIVE
Comment: NORMAL
Neisseria Gonorrhea: NEGATIVE
Trichomonas: POSITIVE — AB

## 2019-11-11 ENCOUNTER — Telehealth (HOSPITAL_COMMUNITY): Payer: Self-pay | Admitting: Emergency Medicine

## 2019-11-11 MED ORDER — METRONIDAZOLE 500 MG PO TABS: 500.0000 mg | ORAL_TABLET | Freq: Two times a day (BID) | ORAL | 0 refills | Status: DC

## 2020-12-27 ENCOUNTER — Other Ambulatory Visit (HOSPITAL_COMMUNITY): Payer: Self-pay

## 2022-06-01 ENCOUNTER — Ambulatory Visit
Admission: RE | Admit: 2022-06-01 | Discharge: 2022-06-01 | Disposition: A | Discharge status: Home / Self Care | Payer: BC Managed Care – PPO | Source: Ambulatory Visit | Attending: Internal Medicine | Admitting: Internal Medicine

## 2022-06-01 ENCOUNTER — Other Ambulatory Visit: Payer: Self-pay

## 2022-06-01 VITALS — BP 126/72 | HR 74 | Temp 98.1°F | Resp 18

## 2022-06-01 DIAGNOSIS — Z202 Contact with and (suspected) exposure to infections with a predominantly sexual mode of transmission: Secondary | ICD-10-CM | POA: Diagnosis not present

## 2022-06-01 MED ORDER — DOXYCYCLINE HYCLATE 100 MG PO CAPS: 100.0000 mg | ORAL_CAPSULE | Freq: Two times a day (BID) | ORAL | 0 refills | Status: DC

## 2022-06-01 NOTE — ED Provider Notes (Signed)
[facility]  Note:  This document was prepared using Dragon voice recognition software and may include unintentional dictation errors.  MRN: [id number] DOB: 1995-04-11 DATE: 06/01/22   Subjective:  Chief Complaint:  Chief Complaint  Patient presents with   SEXUALLY TRANSMITTED DISEASE    I had sex with a girl that said she may have chlamydia or epymimditis . I am having weird warm swollen feeling in my testicles and burn sensation when I urine. - Entered by patient     HPI: Able Malloy is a 27 y.o. male presenting for STD testing. Patient states he had sex with a male on 05/27/2022 and the next day noticed some penile irritation.  He states he contacted his partner and she reported that she could possibly have chlamydia.  He states he has an intermittent warm sensation in his testicles as well as a pain at the tip of his penis.  Per his chart, he does have a history of trichomoniasis.  He states is not having any penile discharge or penile lesions. Denies fever, nausea/vomiting, abdominal pain, dysuria, penile discharge, penile lesions. Endorses penile irritation. Presents NAD.  Prior to Admission medications   Medication Sig Start Date End Date Taking? Authorizing Provider  ibuprofen (ADVIL,MOTRIN) 800 MG tablet Take 1 tablet (800 mg total) by mouth every 8 (eight) hours as needed for mild pain. 06/09/17   Ward, Layla Maw, DO  metroNIDAZOLE (FLAGYL) 500 MG tablet Take 1 tablet (500 mg total) by mouth 2 (two) times daily. Patient not taking: Reported on 06/01/2022 11/11/19   Lamptey, Britta Mccreedy, MD     No Known Allergies  History:   Past Medical History:  Diagnosis Date   Rhabdomyolysis      Past Surgical History:  Procedure Laterality Date   ELBOW SURGERY      Family History  Problem Relation Age of Onset   Diabetes Mother    Healthy Father     Social History   Tobacco Use   Smoking status: Some Days    Packs/day: .25    Types: Cigarettes   Smokeless tobacco:  Never  Vaping Use   Vaping Use: Never used  Substance Use Topics   Alcohol use: Yes    Comment: occasionally   Drug use: Not Currently    Types: Marijuana    Review of Systems  Constitutional:  Negative for fever.  Gastrointestinal:  Negative for abdominal pain, nausea and vomiting.  Genitourinary:  Positive for penile pain. Negative for dysuria, flank pain, genital sores and penile discharge.  Musculoskeletal:  Negative for back pain.     Objective:   Vitals: BP 126/72 (BP Location: Right Arm) Comment (BP Location): large cuff  Pulse 74   Temp 98.1 F (36.7 C) (Oral)   Resp 18   SpO2 96%   Physical Exam Constitutional:      General: He is not in acute distress.    Appearance: Normal appearance. He is well-developed and normal weight. He is not ill-appearing or toxic-appearing.  HENT:     Head: Normocephalic and atraumatic.  Cardiovascular:     Rate and Rhythm: Normal rate and regular rhythm.     Heart sounds: Normal heart sounds.  Pulmonary:     Effort: Pulmonary effort is normal.     Breath sounds: Normal breath sounds.     Comments: Clear to auscultation bilaterally  Abdominal:     General: Bowel sounds are normal.     Palpations: Abdomen is soft.  Tenderness: There is no abdominal tenderness. There is no right CVA tenderness or left CVA tenderness.  Genitourinary:    Comments: GU exam deferred Musculoskeletal:     Lumbar back: Normal.  Skin:    General: Skin is warm and dry.  Neurological:     General: No focal deficit present.     Mental Status: He is alert.  Psychiatric:        Mood and Affect: Mood and affect normal.     Results:  Labs: No results found for this or any previous visit (from the past 24 hour(s)).  Radiology: No results found.   UC Course/Treatments:  Procedures: Procedures   Medications Ordered in UC: Medications - No data to display   Assessment and Plan :     ICD-10-CM   1. Exposure to chlamydia  Z20.2       Exposure to chlamydia: Afebrile, nontoxic-appearing, NAD. VSS. DDX includes but not limited to: Chlamydia, gonorrhea, trichomoniasis, candidiasis Cytology is pending.  Patient requested to be treated at this time.  Doxycycline 100 mg twice daily was prescribed to empirically treat chlamydia.  Patient refused HIV and RPR.  Safe sex precautions advised.  Strict ED precautions were given and patient verbalized understanding.   ED Discharge Orders          Ordered    doxycycline (VIBRAMYCIN) 100 MG capsule  2 times daily        06/01/22 0831             PDMP not reviewed this encounter.     Cynda Acres, PA-C 06/01/22 1610

## 2022-06-01 NOTE — Discharge Instructions (Signed)
Your swab was sent to the lab for further testing.  You will be called with results. Doxycycline is an antibiotic given to treat Chlamydia. Take the prescription as directed.  You should avoid all sexual activity until you have been notified of all your results and have undergone any necessary treatment.  If you are positive, it is recommended that you inform all sexual partners so they can treat be treated as well before having sex again.

## 2022-06-01 NOTE — ED Triage Notes (Signed)
A sexual partner reports she may have chlamydia.  Patient reports a "weird" warm, swollen feeling in testicles and burning sensation with urination.  Noticed symptoms Sunday .

## 2022-06-02 ENCOUNTER — Telehealth: Payer: Self-pay | Admitting: Emergency Medicine

## 2022-06-02 LAB — CYTOLOGY, (ORAL, ANAL, URETHRAL) ANCILLARY ONLY
Chlamydia: NEGATIVE
Comment: NEGATIVE
Comment: NEGATIVE
Comment: NORMAL
Neisseria Gonorrhea: NEGATIVE
Trichomonas: POSITIVE — AB

## 2022-06-02 MED ORDER — METRONIDAZOLE 500 MG PO TABS: 2000.0000 mg | ORAL_TABLET | Freq: Once | ORAL | 0 refills | Status: AC

## 2022-06-04 ENCOUNTER — Ambulatory Visit
Admission: RE | Admit: 2022-06-04 | Discharge: 2022-06-04 | Disposition: A | Discharge status: Home / Self Care | Payer: BC Managed Care – PPO | Source: Ambulatory Visit

## 2022-06-04 NOTE — ED Notes (Signed)
Patient here to be seen for symptoms after testing positive for trichomonas on 4/25. Patient did not pick up new prescription that was sent to pharmacy on 4/26. Nurse instructed patient that he needed to begin the new medication at pharmacy and be sure to take full course. Patient with no other complaints and no other need to see provider. Patient left UC to retrieve prescription and instructed to come back if symptoms did not resolve after taking course of abx.

## 2022-06-05 ENCOUNTER — Ambulatory Visit
Admission: RE | Admit: 2022-06-05 | Discharge: 2022-06-05 | Disposition: A | Discharge status: Home / Self Care | Payer: BC Managed Care – PPO | Source: Ambulatory Visit

## 2022-06-05 VITALS — BP 115/69 | HR 78 | Temp 99.0°F | Resp 18

## 2022-06-05 DIAGNOSIS — R1013 Epigastric pain: Secondary | ICD-10-CM | POA: Diagnosis not present

## 2022-06-05 MED ORDER — FAMOTIDINE 20 MG PO TABS: 20.0000 mg | ORAL_TABLET | Freq: Two times a day (BID) | ORAL | 0 refills | Status: DC | PRN

## 2022-06-05 NOTE — ED Provider Notes (Signed)
[facility]  Note:  This document was prepared using Dragon voice recognition software and may include unintentional dictation errors.  MRN: [id number] DOB: 04-Mar-1995 DATE: 06/05/22   Subjective:  Chief Complaint:  Chief Complaint  Patient presents with   SEXUALLY TRANSMITTED DISEASE    Accidentally drunk two beers after taking metrodizole yesterday . I know it's side effects but I wants to make sure they don't last long . - Entered by patient   GI Problem    HPI: Caleb Morrison is a 27 y.o. male presenting for intermittent epigastric pain for 1 day.  Patient states he was diagnosed with trichomoniasis last week and he took his medication on Sunday night at 7 PM.  He states he had 2 beers around 10 PM and developed epigastric pain this morning.  He states his symptoms come and go, but is concerned that they will last a long time.  He denies nausea and vomiting.  His last bowel movement was yesterday and within normal limits for him. Denies fever, nausea/vomiting, diarrhea. Endorses abdominal pain. Presents NAD.  Prior to Admission medications   Medication Sig Start Date End Date Taking? Authorizing Provider  famotidine (PEPCID) 20 MG tablet Take 1 tablet (20 mg total) by mouth 2 (two) times daily as needed for indigestion. 06/05/22  Yes Almadelia Looman P, PA-C  ibuprofen (ADVIL,MOTRIN) 800 MG tablet Take 1 tablet (800 mg total) by mouth every 8 (eight) hours as needed for mild pain. 06/09/17   Ward, Layla Maw, DO     No Known Allergies  History:   Past Medical History:  Diagnosis Date   Rhabdomyolysis      Past Surgical History:  Procedure Laterality Date   ELBOW SURGERY      Family History  Problem Relation Age of Onset   Diabetes Mother    Healthy Father     Social History   Tobacco Use   Smoking status: Some Days    Packs/day: .25    Types: Cigarettes   Smokeless tobacco: Never  Vaping Use   Vaping Use: Never used  Substance Use Topics   Alcohol use: Yes     Comment: occasionally   Drug use: Not Currently    Types: Marijuana    Review of Systems  Constitutional:  Negative for fever.  Gastrointestinal:  Positive for abdominal pain. Negative for blood in stool, constipation, diarrhea, nausea and vomiting.  Musculoskeletal:  Positive for arthralgias.     Objective:   Vitals: BP 115/69 (BP Location: Right Arm)   Pulse 78   Temp 99 F (37.2 C) (Oral)   Resp 18   SpO2 96%   Physical Exam Constitutional:      General: He is not in acute distress.    Appearance: Normal appearance. He is well-developed and normal weight. He is not ill-appearing or toxic-appearing.  HENT:     Head: Normocephalic and atraumatic.  Cardiovascular:     Rate and Rhythm: Normal rate and regular rhythm.     Heart sounds: Normal heart sounds.  Pulmonary:     Effort: Pulmonary effort is normal.     Breath sounds: Normal breath sounds.     Comments: Clear to auscultation bilaterally  Abdominal:     General: Bowel sounds are normal.     Palpations: Abdomen is soft.     Tenderness: There is no abdominal tenderness.     Hernia: No hernia is present.  Skin:    General: Skin is warm and dry.  Neurological:  General: No focal deficit present.     Mental Status: He is alert.  Psychiatric:        Mood and Affect: Mood and affect normal.     Results:  Labs: No results found for this or any previous visit (from the past 24 hour(s)).  Radiology: No results found.   UC Course/Treatments:  Procedures: Procedures   Medications Ordered in UC: Medications - No data to display   Assessment and Plan :     ICD-10-CM   1. Abdominal pain, epigastric  R10.13      Epigastric pain: Afebrile, nontoxic-appearing, NAD. VSS. DDX includes but not limited to: Gastritis, cholecystitis, gastric ulcer, pancreatitis No acute abdomen on exam. Symptoms are intermittent and do not appear to be currently active. Suspect symptoms are secondary to alcohol and Flagyl  usage. Famotidine 20 mg twice daily was prescribed to help with possible gastritis.  Do not feel emergent evaluation is needed at this time; however, if new or worsening symptoms patient was instructed to go directly to the ER. Strict ED precautions were given and patient verbalized understanding.   ED Discharge Orders          Ordered    famotidine (PEPCID) 20 MG tablet  2 times daily PRN        06/05/22 1141             PDMP not reviewed this encounter.     Cynda Acres, PA-C 06/05/22 1219

## 2022-06-05 NOTE — ED Triage Notes (Signed)
Pt states he was treated for Chlamydia 1 week ago, txt for trich took medication yesterday am and drank two beers around 7:30 and at 10pm and c/o stomach cramps this am, dry throat, thump on left side of chest at times.

## 2022-06-05 NOTE — Discharge Instructions (Signed)
Please noted our office is limited in the tests and imaging we can perform at our facility. Your evaluation was not suggestive of any emergent condition requiring medical intervention at this time. However, some abdominal problems make take more time to appear. Therefore, it is very important for you to pay attention to any new symptoms or worsening of your current condition.   Please go directly to the Emergency Department immediately should you begin to feel worse in any way or have any of the following symptoms: increasing or different abdominal pain, persistent vomiting, inability to drink fluids, fevers, bloody bowel movements, or begin vomiting blood.    

## 2022-06-06 ENCOUNTER — Ambulatory Visit
Admission: RE | Admit: 2022-06-06 | Discharge: 2022-06-06 | Disposition: A | Discharge status: Home / Self Care | Payer: BC Managed Care – PPO | Source: Ambulatory Visit | Attending: Internal Medicine | Admitting: Internal Medicine

## 2022-06-06 VITALS — BP 144/73 | HR 68 | Temp 98.9°F | Resp 18

## 2022-06-06 DIAGNOSIS — R102 Pelvic and perineal pain: Secondary | ICD-10-CM | POA: Diagnosis not present

## 2022-06-06 DIAGNOSIS — N529 Male erectile dysfunction, unspecified: Secondary | ICD-10-CM | POA: Diagnosis not present

## 2022-06-06 DIAGNOSIS — Z7251 High risk heterosexual behavior: Secondary | ICD-10-CM | POA: Insufficient documentation

## 2022-06-06 LAB — POCT URINALYSIS DIP (MANUAL ENTRY)
Blood, UA: NEGATIVE
Glucose, UA: NEGATIVE mg/dL
Leukocytes, UA: NEGATIVE
Nitrite, UA: NEGATIVE
Protein Ur, POC: 30 mg/dL — AB
Spec Grav, UA: >=1.03 — AB (ref 1.010–1.025)
Urobilinogen, UA: 0.2 E.U./dL
pH, UA: 6 (ref 5.0–8.0)

## 2022-06-06 NOTE — Discharge Instructions (Signed)
Your swab and blood work were sent to the lab for further testing.  You will be called with results. You should avoid all sexual activity until you have been notified of all your results and have undergone any necessary treatment.  If you are positive, it is recommended that you inform all sexual partners so they can treat be treated as well before having sex again.   

## 2022-06-06 NOTE — ED Notes (Signed)
In with provider Sharin Mons, APP for chaperone.

## 2022-06-06 NOTE — ED Triage Notes (Signed)
Pt would like to be tested for HIV. Pt  was treated for trich on 06/01/22. Reports his testicles inflamed; he also c/o pain to his lower abd/above penis. Pt also would like his prostate checked. States he is having difficulty achieving/maintaining an erection.

## 2022-06-06 NOTE — ED Provider Notes (Signed)
[facility]  Note:  This document was prepared using Dragon voice recognition software and may include unintentional dictation errors.  MRN: [id number] DOB: 06/23/1995 DATE: 06/06/22   Subjective:  Chief Complaint:  Chief Complaint  Patient presents with   SEXUALLY TRANSMITTED DISEASE    I want to get tested for hiv. And also I want to get my prostate looked and checked on today . I was treated by you guys for trich, but I'm having a hard time getting an erection and want to know if my prostate is infected . - Entered by patient   Exposure to STD     HPI: Caleb Morrison is a 27 y.o. male presenting for erectile dysfunction and intermittent suprapubic and testicular discomfort.  Patient was originally seen on 06/01/2022 for exposure to chlamydia.  At that time, he was started on doxycycline 100 mg twice daily.  When his STD results returned, he was found to be negative for chlamydia and positive for trichomoniasis.  Doxycycline was stopped at that time he was started on Flagyl 2 g once.  The patient states he took his prescription on Sunday around 7:30 PM and had 2 beers around 10 PM that night.  The next morning he developed epigastric pain that appears to have since resolved.  He presents today because he became concerned last night when he was at work and was developing some suprapubic discomfort as well as testicular discomfort.  He is also concerned when he woke up today and did not have an erection.  He states he has had one STD before, but he is not sure which STD that was. He denies dysuria, nausea/vomiting, perianal pain.  Denies fever, penile discharge, penile lesions. Endorses abdominal pain and rectal dysfunction. Presents NAD.  Prior to Admission medications   Medication Sig Start Date End Date Taking? Authorizing Provider  famotidine (PEPCID) 20 MG tablet Take 1 tablet (20 mg total) by mouth 2 (two) times daily as needed for indigestion. 06/05/22   Evert Wenrich P, PA-C   ibuprofen (ADVIL,MOTRIN) 800 MG tablet Take 1 tablet (800 mg total) by mouth every 8 (eight) hours as needed for mild pain. 06/09/17   Ward, Layla Maw, DO     No Known Allergies  History:   Past Medical History:  Diagnosis Date   Rhabdomyolysis      Past Surgical History:  Procedure Laterality Date   ELBOW SURGERY      Family History  Problem Relation Age of Onset   Diabetes Mother    Healthy Father     Social History   Tobacco Use   Smoking status: Some Days    Packs/day: .25    Types: Cigarettes   Smokeless tobacco: Never  Vaping Use   Vaping Use: Never used  Substance Use Topics   Alcohol use: Yes    Comment: occasionally   Drug use: Not Currently    Types: Marijuana    Review of Systems  Constitutional:  Positive for fatigue. Negative for fever.  Gastrointestinal:  Positive for abdominal pain and diarrhea. Negative for blood in stool, nausea, rectal pain and vomiting.  Genitourinary:  Positive for testicular pain. Negative for dysuria, flank pain, genital sores, hematuria, penile discharge and penile pain.  Musculoskeletal:  Negative for back pain.  All other systems reviewed and are negative.    Objective:   Vitals: BP (!) 144/73 (BP Location: Right Arm)   Pulse 68   Temp 98.9 F (37.2 C) (Oral)   Resp 18  SpO2 95%   Physical Exam Exam conducted with a chaperone present.  Constitutional:      General: He is not in acute distress.    Appearance: Normal appearance. He is well-developed and normal weight. He is not ill-appearing or toxic-appearing.  HENT:     Head: Normocephalic and atraumatic.  Cardiovascular:     Rate and Rhythm: Normal rate and regular rhythm.     Heart sounds: Normal heart sounds.  Pulmonary:     Effort: Pulmonary effort is normal.     Breath sounds: Normal breath sounds.  Abdominal:     General: Bowel sounds are normal.     Palpations: Abdomen is soft.     Tenderness: There is no abdominal tenderness. There is no right  CVA tenderness or left CVA tenderness.     Hernia: No hernia is present. There is no hernia in the left inguinal area or right inguinal area.  Genitourinary:    Penis: Circumcised.      Testes: Normal.        Right: Mass or tenderness not present.        Left: Mass or tenderness not present.     Epididymis:     Right: No tenderness.     Left: No tenderness.     Prostate: Normal.     Rectum: Normal.  Skin:    General: Skin is warm and dry.  Neurological:     General: No focal deficit present.     Mental Status: He is alert.  Psychiatric:        Mood and Affect: Mood and affect normal.     Results:  Labs: Results for orders placed or performed during the hospital encounter of 06/06/22 (from the past 24 hour(s))  POCT urinalysis dipstick     Status: Abnormal   Collection Time: 06/06/22  8:56 AM  Result Value Ref Range   Color, UA straw (A) yellow   Clarity, UA clear clear   Glucose, UA negative negative mg/dL   Bilirubin, UA small (A) negative   Ketones, POC UA trace (5) (A) negative mg/dL   Spec Grav, UA >=1.610 (A) 1.010 - 1.025   Blood, UA negative negative   pH, UA 6.0 5.0 - 8.0   Protein Ur, POC =30 (A) negative mg/dL   Urobilinogen, UA 0.2 0.2 or 1.0 E.U./dL   Nitrite, UA Negative Negative   Leukocytes, UA Negative Negative    Radiology: No results found.   UC Course/Treatments:  Procedures: Procedures   Medications Ordered in UC: Medications - No data to display   Assessment and Plan :     ICD-10-CM   1. High risk sexual behavior, unspecified type  Z72.51 RPR    HIV Antibody (routine testing w rflx)    HIV4GL Save Tube    Cytology (oral, anal, urethral) ancillary only    RPR    HIV Antibody (routine testing w rflx)    HIV4GL Save Tube    Cytology (oral, anal, urethral) ancillary only    2. Suprapubic discomfort  R10.2 Urine Culture    Urine Culture      High risk sexual behavior: Afebrile, nontoxic-appearing, NAD. VSS. DDX includes but not  limited to: Gonorrhea, trichomoniasis, chlamydia, epididymitis Patient was previously treated for trichomoniasis.  He is concerned because he continues to have some suprapubic and testicular discomfort intermittently.  He has a difficult time describing the discomfort, but states it is not painful.  Recommend he have repeat cytology to see if  trichomoniasis has resolved.  He may need extended course of Flagyl. Cytology is pending.  HIV and RPR order today as well. Do not suspect epidymidis given recent Flagyl treatment and negative UA today; however, I do recommend patient follow up with urology. Strict ED precautions were given and patient verbalized understanding.  Suprapubic discomfort: Afebrile, nontoxic-appearing, NAD. VSS. DDX includes but not limited to: Cystitis, interstitial cystitis, gonorrhea, trichomoniasis, chlamydia, epididymitis UA was unremarkable today in office. Urine culture is pending. Cytology is pending as well. Patient may need further treatment with Flagyl. Strict ED precautions were given and patient verbalized understanding.  Erectile dysfunction: Afebrile, nontoxic-appearing, NAD. VSS. DDX includes but not limited to: ED, drug-induced ED, psych induced ED Patient reports one episode of inability to achieve erection.  States symptoms started after taking the Flagyl.  Unaware of any ED related side effects of Flagyl and symptoms appear to be psych related.  Recommend patient follow-up with urologist soon as possible.  Prostate was unremarkable on PE.  Patient was provided with information for an urologist here in the area.  Strict ED precautions were given and patient verbalized understanding.  ED Discharge Orders     None        PDMP not reviewed this encounter.     Cynda Acres, PA-C 06/06/22 1610

## 2022-06-07 LAB — HIV ANTIBODY (ROUTINE TESTING W REFLEX): HIV Screen 4th Generation wRfx: NONREACTIVE

## 2022-06-07 LAB — URINE CULTURE: Culture: NO GROWTH

## 2022-06-07 LAB — RPR: RPR Ser Ql: NONREACTIVE

## 2022-06-08 LAB — CYTOLOGY, (ORAL, ANAL, URETHRAL) ANCILLARY ONLY
Chlamydia: NEGATIVE
Comment: NEGATIVE
Comment: NEGATIVE
Comment: NORMAL
Neisseria Gonorrhea: NEGATIVE
Trichomonas: NEGATIVE

## 2022-06-17 DIAGNOSIS — R110 Nausea: Secondary | ICD-10-CM | POA: Diagnosis not present

## 2022-06-17 DIAGNOSIS — R519 Headache, unspecified: Secondary | ICD-10-CM | POA: Diagnosis not present

## 2022-08-23 ENCOUNTER — Ambulatory Visit
Admission: RE | Admit: 2022-08-23 | Discharge: 2022-08-23 | Disposition: A | Discharge status: Home / Self Care | Payer: BC Managed Care – PPO | Source: Ambulatory Visit | Attending: Internal Medicine | Admitting: Internal Medicine

## 2022-08-23 VITALS — BP 148/83 | HR 67 | Temp 97.6°F | Resp 18

## 2022-08-23 DIAGNOSIS — Z113 Encounter for screening for infections with a predominantly sexual mode of transmission: Secondary | ICD-10-CM | POA: Insufficient documentation

## 2022-08-23 NOTE — ED Provider Notes (Signed)
[facility]  Note:  This document was prepared using Dragon voice recognition software and may include unintentional dictation errors.  MRN: [id number] DOB: 28-Oct-1995 DATE: 08/23/22   Subjective:  Chief Complaint:  Chief Complaint  Patient presents with   SEXUALLY TRANSMITTED DISEASE    Just want to get tested for any stds . Not having major concerns or worries. But i do want to get tested and review my results . Thank you - Entered by patient    HPI: Caleb Morrison is a 27 y.o. male presenting for STD testing. Patient is currently asymptomatic. Patient recently tested positive for trichomoniasis in April 2024. He completed treatment as directed and retested with negative results. No new partners at this time. Negative for HIV and syphilis in April 2024 as well. Denies fever, nausea/vomiting, abdominal pain, penile discharge, penile lesions. Presents NAD.  Prior to Admission medications   Medication Sig Start Date End Date Taking? Authorizing Provider  famotidine (PEPCID) 20 MG tablet Take 1 tablet (20 mg total) by mouth 2 (two) times daily as needed for indigestion. 06/05/22   Hazelene Doten P, PA-C  ibuprofen (ADVIL,MOTRIN) 800 MG tablet Take 1 tablet (800 mg total) by mouth every 8 (eight) hours as needed for mild pain. 06/09/17   Ward, Layla Maw, DO     No Known Allergies  History:   Past Medical History:  Diagnosis Date   Rhabdomyolysis      Past Surgical History:  Procedure Laterality Date   ELBOW SURGERY      Family History  Problem Relation Age of Onset   Diabetes Mother    Healthy Father     Social History   Tobacco Use   Smoking status: Former    Current packs/day: 0.25    Types: Cigarettes   Smokeless tobacco: Never  Vaping Use   Vaping status: Never Used  Substance Use Topics   Alcohol use: Yes    Comment: occasionally   Drug use: Not Currently    Types: Marijuana    Review of Systems  Constitutional:  Negative for fever.   Gastrointestinal:  Negative for abdominal pain, nausea and vomiting.  Genitourinary:  Negative for dysuria, genital sores and penile discharge.     Objective:   Vitals: BP (!) 148/83 (BP Location: Right Arm)   Pulse 67   Temp 97.6 F (36.4 C) (Oral)   Resp 18   SpO2 97%   Physical Exam Constitutional:      General: He is not in acute distress.    Appearance: Normal appearance. He is well-developed and normal weight. He is not ill-appearing or toxic-appearing.  HENT:     Head: Normocephalic and atraumatic.  Cardiovascular:     Rate and Rhythm: Normal rate and regular rhythm.     Heart sounds: Normal heart sounds.  Pulmonary:     Effort: Pulmonary effort is normal.     Breath sounds: Normal breath sounds.     Comments: Clear to auscultation bilaterally  Abdominal:     General: Bowel sounds are normal.     Palpations: Abdomen is soft.     Tenderness: There is no abdominal tenderness.  Skin:    General: Skin is warm and dry.  Neurological:     General: No focal deficit present.     Mental Status: He is alert.  Psychiatric:        Mood and Affect: Mood and affect normal.     Results:  Labs: No results found for this or  any previous visit (from the past 24 hour(s)).  Radiology: No results found.   UC Course/Treatments:  Procedures: Procedures   Medications Ordered in UC: Medications - No data to display   Assessment and Plan :     ICD-10-CM   1. Screening for STD (sexually transmitted disease)  Z11.3      Screening for STD (sexually transmitted disease) Afebrile, nontoxic-appearing, NAD. VSS. Asymptomatic. Refused HIV and RPR at this time. Negative in 05/2022 for both HIV and RPR. Cytology pending. Safe sex precautions advised. Strict ED precautions were given and patient verbalized understanding.  ED Discharge Orders     None        PDMP not reviewed this encounter.     Jaxtyn Linville P, PA-C 08/23/22 0840

## 2022-08-23 NOTE — ED Triage Notes (Signed)
Pt requesting STD testing, no sxs/complaints. Treated for trich recently, requesting re-testing.

## 2022-08-23 NOTE — Discharge Instructions (Signed)
Your swabs was sent to the lab for further testing.  You will be called with results. You should avoid all sexual activity until you have been notified of all your results and have undergone any necessary treatment.  If you are positive, it is recommended that you inform all sexual partners so they can treat be treated as well before having sex again.

## 2022-08-24 LAB — CYTOLOGY, (ORAL, ANAL, URETHRAL) ANCILLARY ONLY
Chlamydia: NEGATIVE
Comment: NEGATIVE
Comment: NEGATIVE
Comment: NORMAL
Neisseria Gonorrhea: NEGATIVE
Trichomonas: NEGATIVE

## 2022-09-06 ENCOUNTER — Ambulatory Visit: Payer: Self-pay

## 2022-09-07 DIAGNOSIS — Z202 Contact with and (suspected) exposure to infections with a predominantly sexual mode of transmission: Secondary | ICD-10-CM | POA: Diagnosis not present

## 2022-09-07 DIAGNOSIS — R300 Dysuria: Secondary | ICD-10-CM | POA: Diagnosis not present

## 2022-10-19 ENCOUNTER — Ambulatory Visit
Admission: RE | Admit: 2022-10-19 | Discharge: 2022-10-19 | Disposition: A | Discharge status: Home / Self Care | Payer: BC Managed Care – PPO | Source: Ambulatory Visit | Attending: Internal Medicine | Admitting: Internal Medicine

## 2022-10-19 VITALS — BP 132/82 | HR 72 | Temp 98.1°F | Resp 18

## 2022-10-19 DIAGNOSIS — Z113 Encounter for screening for infections with a predominantly sexual mode of transmission: Secondary | ICD-10-CM | POA: Insufficient documentation

## 2022-10-19 NOTE — Discharge Instructions (Signed)
Your swab and blood work were sent to the lab for further testing.  You will be called with results. You should avoid all sexual activity until you have been notified of all your results and have undergone any necessary treatment.  If you are positive, it is recommended that you inform all sexual partners so they can treat be treated as well before having sex again.

## 2022-10-19 NOTE — ED Provider Notes (Signed)
[facility]  Note:  This document was prepared using Dragon voice recognition software and may include unintentional dictation errors.  MRN: [id number] DOB: August 01, 1995 DATE: 10/19/22   Subjective:  Chief Complaint:  Chief Complaint  Patient presents with   SEXUALLY TRANSMITTED DISEASE    I just want to get tested for all stds and hiv. - Entered by patient    HPI: Caleb Morrison is a 27 y.o. male presenting for STD testing. Patient is currently asymptomatic. Reports same partner. No known exposures. Requesting full work up. Denies fever, nausea/vomiting, dysuria, penile discharge, penile lesions. Presents NAD.  Prior to Admission medications   Medication Sig Start Date End Date Taking? Authorizing Provider  famotidine (PEPCID) 20 MG tablet Take 1 tablet (20 mg total) by mouth 2 (two) times daily as needed for indigestion. 06/05/22   Lekeshia Kram P, PA-C  ibuprofen (ADVIL,MOTRIN) 800 MG tablet Take 1 tablet (800 mg total) by mouth every 8 (eight) hours as needed for mild pain. 06/09/17   Ward, Layla Maw, DO     No Known Allergies  History:   Past Medical History:  Diagnosis Date   Rhabdomyolysis      Past Surgical History:  Procedure Laterality Date   ELBOW SURGERY      Family History  Problem Relation Age of Onset   Diabetes Mother    Healthy Father     Social History   Tobacco Use   Smoking status: Former    Current packs/day: 0.25    Types: Cigarettes   Smokeless tobacco: Never  Vaping Use   Vaping status: Never Used  Substance Use Topics   Alcohol use: Yes    Comment: occasionally   Drug use: Not Currently    Types: Marijuana    Review of Systems  Constitutional:  Negative for fever.  Gastrointestinal:  Negative for nausea and vomiting.  Genitourinary:  Negative for dysuria, penile discharge and penile pain.     Objective:   Vitals: BP 132/82 (BP Location: Right Arm)   Pulse 72   Temp 98.1 F (36.7 C) (Oral)   Resp 18   SpO2 98%    Physical Exam Constitutional:      General: He is not in acute distress.    Appearance: Normal appearance. He is well-developed and normal weight. He is not ill-appearing or toxic-appearing.  HENT:     Head: Normocephalic and atraumatic.  Cardiovascular:     Rate and Rhythm: Normal rate and regular rhythm.     Heart sounds: Normal heart sounds.  Pulmonary:     Effort: Pulmonary effort is normal.     Breath sounds: Normal breath sounds.     Comments: Clear to auscultation bilaterally  Abdominal:     General: Bowel sounds are normal.     Palpations: Abdomen is soft.     Tenderness: There is no abdominal tenderness.  Skin:    General: Skin is warm and dry.  Neurological:     General: No focal deficit present.     Mental Status: He is alert.  Psychiatric:        Mood and Affect: Mood and affect normal.     Results:  Labs: No results found for this or any previous visit (from the past 24 hour(s)).  Radiology: No results found.   UC Course/Treatments:  Procedures: Procedures   Medications Ordered in UC: Medications - No data to display   Assessment and Plan :     ICD-10-CM   1. Screening  for STDs (sexually transmitted diseases)  Z11.3 Cytology (oral, anal, urethral) ancillary only    RPR    HIV Antibody (routine testing w rflx)    HIV4GL Save Tube    Cytology (oral, anal, urethral) ancillary only    RPR    HIV Antibody (routine testing w rflx)    HIV4GL Save Tube     Screening for STDs (sexually transmitted diseases) Afebrile, nontoxic-appearing, NAD. VSS. Patient is currently asymptomatic. Requesting full work up. Will treat based on results. Safe sex precautions advised. Strict ED precautions were given and patient verbalized understanding.  ED Discharge Orders     None        PDMP not reviewed this encounter.     Corbin Falck P, PA-C 10/19/22 1801

## 2022-10-19 NOTE — ED Triage Notes (Signed)
Pt requesting STD testing, no sxs/complaints.

## 2022-10-20 LAB — CYTOLOGY, (ORAL, ANAL, URETHRAL) ANCILLARY ONLY
Chlamydia: NEGATIVE
Comment: NEGATIVE
Comment: NEGATIVE
Comment: NORMAL
Neisseria Gonorrhea: NEGATIVE
Trichomonas: NEGATIVE

## 2022-10-20 LAB — HIV ANTIBODY (ROUTINE TESTING W REFLEX): HIV Screen 4th Generation wRfx: NONREACTIVE

## 2022-10-20 LAB — RPR: RPR Ser Ql: NONREACTIVE

## 2022-12-10 ENCOUNTER — Ambulatory Visit: Payer: Self-pay

## 2022-12-11 ENCOUNTER — Other Ambulatory Visit: Payer: Self-pay

## 2022-12-11 ENCOUNTER — Ambulatory Visit
Admission: RE | Admit: 2022-12-11 | Discharge: 2022-12-11 | Disposition: A | Discharge status: Home / Self Care | Payer: BC Managed Care – PPO | Source: Ambulatory Visit | Attending: Emergency Medicine | Admitting: Emergency Medicine

## 2022-12-11 VITALS — BP 165/77 | HR 90 | Temp 99.3°F | Resp 16

## 2022-12-11 DIAGNOSIS — Z7689 Persons encountering health services in other specified circumstances: Secondary | ICD-10-CM

## 2022-12-11 DIAGNOSIS — Z1283 Encounter for screening for malignant neoplasm of skin: Secondary | ICD-10-CM

## 2022-12-11 DIAGNOSIS — Z1329 Encounter for screening for other suspected endocrine disorder: Secondary | ICD-10-CM

## 2022-12-11 DIAGNOSIS — J020 Streptococcal pharyngitis: Secondary | ICD-10-CM | POA: Insufficient documentation

## 2022-12-11 DIAGNOSIS — Z113 Encounter for screening for infections with a predominantly sexual mode of transmission: Secondary | ICD-10-CM | POA: Insufficient documentation

## 2022-12-11 LAB — POCT RAPID STREP A (OFFICE): Rapid Strep A Screen: POSITIVE — AB

## 2022-12-11 MED ORDER — AMOXICILLIN 500 MG PO CAPS: 500.0000 mg | ORAL_CAPSULE | Freq: Two times a day (BID) | ORAL | 0 refills | Status: AC

## 2022-12-11 NOTE — Discharge Instructions (Addendum)
Your strep test today is positive.  I recommend that you begin antibiotics now for treatment.  I have sent a prescription to your pharmacy.  Please take them as prescribed.  You will begin to feel better in about 24 hours.  Please be sure that you you finish the entire 10-day course of treatment to avoid worsening infection that may require longer treatment with stronger antibiotics. After 24 hours, please discard your toothbrush as well as any other oral devices that you are currently using and replace them with new ones to avoid reinfection.  Also after 24 hours, you will no longer be contagious.    Please read below to learn more about the medications, dosages and frequencies that I recommend to help alleviate your symptoms and to get you feeling better soon:   Amoxicillin:  Please take one (1) dose twice daily for 10 days.  This antibiotic can cause upset stomach, this will resolve once antibiotics are complete.  You are welcome to take a probiotic, eat yogurt, take Imodium while taking this medication.  Please avoid other systemic medications such as Maalox, Pepto-Bismol or milk of magnesia as they can interfere with the body's ability to absorb the antibiotics.   Advil, Motrin (ibuprofen): This is a good anti-inflammatory medication which addresses aches, pains and inflammation of the upper airways that causes sinus and nasal congestion as well as in the lower airways which makes your cough feel tight and sometimes burn.  I recommend that you take between 400 to 600 mg every 6-8 hours as needed.  Please do not take more than 2400 mg of ibuprofen in a 24-hour period and please do not take high doses of ibuprofen for more than 3 days in a row as this can lead to stomach ulcers.   The results of your oral and penile STD testing today which screens for gonorrhea, chlamydia, and trichomonas will be made posted to your MyChart account once it is complete.  This typically takes 2 to 4 days.  Please abstain  from sexual intercourse of any kind, vaginal, oral or anal, until you have received the results of your STD testing.      The results of your HIV and syphilis blood tests will be made available to you once they are complete.  They will initially be posted to your MyChart account which typically takes 2 to 3 days.      If any of your results are abnormal, you will receive a phone call regarding treatment.  Prescriptions, if any are needed, will be provided for you at your pharmacy.      If you have not had complete resolution of your symptoms after completing any recommended treatment or if your symptoms worsen, please return for repeat evaluation.     Thank you for visiting [facility] today.  We appreciate the opportunity to participate in your care.

## 2022-12-11 NOTE — ED Triage Notes (Signed)
Patient requesting std / hiv testing.    Reports having oral sex with a new partner, now feels like mouth and throat are sore.

## 2022-12-11 NOTE — ED Provider Notes (Signed)
[facility]    CSN: [id number] Arrival date & time: 12/11/22  1615    HISTORY   Chief Complaint  Patient presents with   SEXUALLY TRANSMITTED DISEASE    White tongue and discharge almost from it . Want to get tested for std and hiv - Entered by patient   Appointment    16:30   HPI Caleb Morrison is a pleasant, 27 y.o. male who presents to urgent care today. Patient complains of recently having oral sex with a new sexual partner a few days ago, states his mouth and throat now feel very sore, endorses pain with swallowing.  Patient has a slightly elevated temperature on arrival. Patient endorses concern for STD exposure.   Patient denies genital lesion, penile discharge, testicular pain or swelling, scrotal pain or swelling, perineal pain, rectal pain, pain with defecation, abdominal pain, suprapubic pain, burning during urination, fever , body aches, chills, rigors, malaise, significant fatigue, altered mental status, nausea, vomiting, diarrhea, and constipation.   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 Authorizing Provider  famotidine (PEPCID) 20 MG tablet Take 1 tablet (20 mg total) by mouth 2 (two) times daily as needed for indigestion. Patient not taking: Reported on 12/11/2022 06/05/22   Hermanns, Ashlee P, PA-C  ibuprofen (ADVIL,MOTRIN) 800 MG tablet Take 1 tablet (800 mg total) by mouth every 8 (eight)  hours as needed for mild pain. 06/09/17   Ward, Layla Maw, DO    Family History Family History  Problem Relation Age of Onset   Diabetes Mother    Healthy Father    Social History Social History   Tobacco Use   Smoking status: Every Day    Current packs/day: 0.25    Types: Cigarettes   Smokeless tobacco: Never  Vaping Use   Vaping status: Never Used  Substance Use Topics   Alcohol use: Yes    Comment: occasionally   Drug use: Not Currently    Types: Marijuana   Allergies   Patient has no known allergies.  Review of Systems Review of Systems Pertinent findings revealed after performing a 14 point review of systems has been noted in the history of present illness.  Physical Exam Vital Signs BP (!) 165/77 (BP Location: Right Arm)   Pulse 90   Temp 99.3 F (37.4 C) (Oral)   Resp 16   SpO2 95%   No data found.  Physical Exam Vitals and nursing note reviewed.  Constitutional:      General: He is not in acute distress.    Appearance: Normal appearance. He is well-developed. He is not ill-appearing or toxic-appearing.  HENT:     Head: Normocephalic and atraumatic.     Salivary Glands: Right salivary gland is diffusely enlarged and tender. Left salivary gland is diffusely enlarged and tender.     Right Ear: Hearing and external ear normal.     Left Ear: Hearing and external  ear normal.     Ears:     Comments: Bilateral EACs with mild erythema, bilateral TMs are normal    Nose: No mucosal edema, congestion or rhinorrhea.     Right Turbinates: Not enlarged, swollen or pale.     Left Turbinates: Not enlarged or swollen.     Right Sinus: No maxillary sinus tenderness or frontal sinus tenderness.     Left Sinus: No maxillary sinus tenderness or frontal sinus tenderness.     Mouth/Throat:     Lips: Pink. No lesions.     Mouth: Mucous membranes are moist. No oral lesions or angioedema.     Dentition: No gingival swelling.     Tongue: No lesions.     Palate: No mass.      Pharynx: Uvula midline. Pharyngeal swelling, oropharyngeal exudate and posterior oropharyngeal erythema present. No uvula swelling.     Tonsils: Tonsillar exudate present. 2+ on the right. 2+ on the left.  Eyes:     General: Lids are normal.        Right eye: No discharge.        Left eye: No discharge.     Extraocular Movements: Extraocular movements intact.     Conjunctiva/sclera: Conjunctivae normal.     Right eye: Right conjunctiva is not injected.     Left eye: Left conjunctiva is not injected.     Pupils: Pupils are equal, round, and reactive to light.  Neck:     Thyroid: No thyroid mass, thyromegaly or thyroid tenderness.     Trachea: Phonation normal. Tracheal tenderness present. No abnormal tracheal secretions or tracheal deviation.     Comments: Voice is muffled Cardiovascular:     Rate and Rhythm: Normal rate and regular rhythm.     Pulses: Normal pulses.     Heart sounds: Normal heart sounds, S1 normal and S2 normal. No murmur heard.    No friction rub. No gallop.  Pulmonary:     Effort: Pulmonary effort is normal. No accessory muscle usage, prolonged expiration, respiratory distress or retractions.     Breath sounds: Normal breath sounds. No stridor, decreased air movement or transmitted upper airway sounds. No decreased breath sounds, wheezing, rhonchi or rales.  Chest:     Chest wall: No tenderness.  Abdominal:     General: Bowel sounds are normal.     Palpations: Abdomen is soft.     Tenderness: There is no abdominal tenderness. There is no right CVA tenderness, left CVA tenderness or rebound. Negative signs include Murphy's sign.     Hernia: No hernia is present.  Genitourinary:    Comments: Pt politely declines GU exam, pt did provide a penile swab for testing.   Musculoskeletal:        General: No tenderness. Normal range of motion.     Cervical back: Full passive range of motion without pain, normal range of motion and neck supple. Normal range of motion.      Right lower leg: No edema.     Left lower leg: No edema.  Lymphadenopathy:     Cervical: Cervical adenopathy present.     Right cervical: Superficial cervical adenopathy present.     Left cervical: Superficial cervical adenopathy present.  Skin:    General: Skin is warm and dry.     Findings: No erythema, lesion or rash.  Neurological:     General: No focal deficit present.     Mental Status: He is alert and oriented to person, place, and  time. Mental status is at baseline.  Psychiatric:        Mood and Affect: Mood normal.        Behavior: Behavior normal.        Thought Content: Thought content normal.        Judgment: Judgment normal.     Visual Acuity Right Eye Distance:   Left Eye Distance:   Bilateral Distance:    Right Eye Near:   Left Eye Near:    Bilateral Near:     UC Couse / Diagnostics / Procedures:     Radiology No results found.  Procedures Procedures (including critical care time) EKG  Pending results:  Labs Reviewed  POCT RAPID STREP A (OFFICE) - Abnormal; Notable for the following components:      Result Value   Rapid Strep A Screen Positive (*)    All other components within normal limits  HIV ANTIBODY (ROUTINE TESTING W REFLEX)  RPR  CYTOLOGY, (ORAL, ANAL, URETHRAL) ANCILLARY ONLY  CYTOLOGY, (ORAL, ANAL, URETHRAL) ANCILLARY ONLY    Medications Ordered in UC: Medications - No data to display  UC Diagnoses / Final Clinical Impressions(s)   I have reviewed the triage vital signs and the nursing notes.  Pertinent labs & imaging results that were available during my care of the patient were reviewed by me and considered in my medical decision making (see chart for details).    Final diagnoses:  Screening examination for STD (sexually transmitted disease)  Acute streptococcal pharyngitis   Rapid strep test today was positive.  Patient provided with a 10-day course of amoxicillin and advised to change toothbrush after 24 hours of  treatment.  STD screening was performed, patient advised that the results be posted to their MyChart and if any of the results are positive, they will be notified by phone, further treatment will be provided as indicated based on results of STD screening.  Patient was advised to abstain from sexual intercourse until that they receive the results of their STD testing.  Patient was also advised to use condoms to protect themselves from STD exposure.  Please see discharge instructions below for details of plan of care as provided to patient. ED Prescriptions     Medication Sig Dispense Auth. Provider   amoxicillin (AMOXIL) 500 MG capsule Take 1 capsule (500 mg total) by mouth 2 (two) times daily for 10 days. 20 capsule Theadora Rama Scales, PA-C      PDMP not reviewed this encounter.  Disposition Upon Discharge:  Condition: stable for discharge home  Patient presented with concern for an acute illness with associated systemic symptoms and significant discomfort requiring urgent management. In my opinion, this is a condition that a prudent lay person (someone who possesses an average knowledge of health and medicine) may potentially expect to result in complications if not addressed urgently such as respiratory distress, impairment of bodily function or dysfunction of bodily organs.   As such, the patient has been evaluated and assessed, work-up was performed and treatment was provided in alignment with urgent care protocols and evidence based medicine.  Patient/parent/caregiver has been advised that the patient may require follow up for further testing and/or treatment if the symptoms continue in spite of treatment, as clinically indicated and appropriate.  Routine symptom specific, illness specific and/or disease specific instructions were discussed with the patient and/or caregiver at length.  Prevention strategies for avoiding STD exposure were also discussed.  The patient will follow up  with their current PCP if  and as advised. If the patient does not currently have a PCP we will assist them in obtaining one.   The patient may need specialty follow up if the symptoms continue, in spite of conservative treatment and management, for further workup, evaluation, consultation and treatment as clinically indicated and appropriate.  Patient/parent/caregiver verbalized understanding and agreement of plan as discussed.  All questions were addressed during visit.  Please see discharge instructions below for further details of plan.  Discharge Instructions:   Discharge Instructions      Your strep test today is positive.  I recommend that you begin antibiotics now for treatment.  I have sent a prescription to your pharmacy.  Please take them as prescribed.  You will begin to feel better in about 24 hours.  Please be sure that you you finish the entire 10-day course of treatment to avoid worsening infection that may require longer treatment with stronger antibiotics. After 24 hours, please discard your toothbrush as well as any other oral devices that you are currently using and replace them with new ones to avoid reinfection.  Also after 24 hours, you will no longer be contagious.    Please read below to learn more about the medications, dosages and frequencies that I recommend to help alleviate your symptoms and to get you feeling better soon:   Amoxicillin:  Please take one (1) dose twice daily for 10 days.  This antibiotic can cause upset stomach, this will resolve once antibiotics are complete.  You are welcome to take a probiotic, eat yogurt, take Imodium while taking this medication.  Please avoid other systemic medications such as Maalox, Pepto-Bismol or milk of magnesia as they can interfere with the body's ability to absorb the antibiotics.   Advil, Motrin (ibuprofen): This is a good anti-inflammatory medication which addresses aches, pains and inflammation of the upper airways  that causes sinus and nasal congestion as well as in the lower airways which makes your cough feel tight and sometimes burn.  I recommend that you take between 400 to 600 mg every 6-8 hours as needed.  Please do not take more than 2400 mg of ibuprofen in a 24-hour period and please do not take high doses of ibuprofen for more than 3 days in a row as this can lead to stomach ulcers.   The results of your oral and penile STD testing today which screens for gonorrhea, chlamydia, and trichomonas will be made posted to your MyChart account once it is complete.  This typically takes 2 to 4 days.  Please abstain from sexual intercourse of any kind, vaginal, oral or anal, until you have received the results of your STD testing.      The results of your HIV and syphilis blood tests will be made available to you once they are complete.  They will initially be posted to your MyChart account which typically takes 2 to 3 days.      If any of your results are abnormal, you will receive a phone call regarding treatment.  Prescriptions, if any are needed, will be provided for you at your pharmacy.      If you have not had complete resolution of your symptoms after completing any recommended treatment or if your symptoms worsen, please return for repeat evaluation.     Thank you for visiting  Urgent Care today.  We appreciate the opportunity to participate in your care.       This office note has been dictated using  Dragon Engineer, civil (consulting).  Unfortunately, this method of dictation can sometimes lead to typographical or grammatical errors.  I apologize for your inconvenience in advance if this occurs.  Please do not hesitate to reach out to me if clarification is needed.       Theadora Rama Scales, PA-C 12/11/22 1733

## 2022-12-12 LAB — CYTOLOGY, (ORAL, ANAL, URETHRAL) ANCILLARY ONLY
Chlamydia: NEGATIVE
Chlamydia: NEGATIVE
Comment: NEGATIVE
Comment: NEGATIVE
Comment: NORMAL
Comment: NEGATIVE
Comment: NEGATIVE
Comment: NORMAL
Neisseria Gonorrhea: NEGATIVE
Neisseria Gonorrhea: NEGATIVE
Trichomonas: NEGATIVE
Trichomonas: NEGATIVE

## 2022-12-12 LAB — HIV ANTIBODY (ROUTINE TESTING W REFLEX): HIV Screen 4th Generation wRfx: NONREACTIVE

## 2022-12-12 LAB — RPR: RPR Ser Ql: NONREACTIVE

## 2022-12-27 ENCOUNTER — Ambulatory Visit: Payer: Self-pay

## 2022-12-28 ENCOUNTER — Ambulatory Visit
Admission: RE | Admit: 2022-12-28 | Discharge: 2022-12-28 | Disposition: A | Discharge status: Home / Self Care | Payer: BC Managed Care – PPO | Source: Ambulatory Visit | Attending: Internal Medicine | Admitting: Internal Medicine

## 2022-12-28 VITALS — BP 157/74 | HR 86 | Temp 98.4°F | Resp 16

## 2022-12-28 DIAGNOSIS — J029 Acute pharyngitis, unspecified: Secondary | ICD-10-CM | POA: Insufficient documentation

## 2022-12-28 DIAGNOSIS — J209 Acute bronchitis, unspecified: Secondary | ICD-10-CM | POA: Insufficient documentation

## 2022-12-28 LAB — POCT INFLUENZA A/B
Influenza A, POC: NEGATIVE
Influenza B, POC: NEGATIVE

## 2022-12-28 LAB — POCT MONO SCREEN (KUC): Mono, POC: NEGATIVE

## 2022-12-28 LAB — POCT RAPID STREP A (OFFICE): Rapid Strep A Screen: NEGATIVE

## 2022-12-28 MED ORDER — AZITHROMYCIN 250 MG PO TABS
ORAL_TABLET | ORAL | 0 refills | Status: DC
Start: 2022-12-28 — End: 2023-01-07

## 2022-12-28 MED ORDER — BENZONATATE 200 MG PO CAPS: 200.0000 mg | ORAL_CAPSULE | Freq: Three times a day (TID) | ORAL | 0 refills | Status: DC | PRN

## 2022-12-28 NOTE — Discharge Instructions (Signed)
Your strep test was negative and your mono test was negative. A throat culture was sent to the lab for further testing.  You will be called with the results of your culture. Recommend salt water gargling and over-the-counter Ibuprofen/Tylenol as needed for pain if you are not allergic. Please go directly to the Emergency Department immediately should you begin to have any of the following symptoms: increased pain, persistent fevers, difficulty swallowing, difficulty talking, drooling or difficulty breathing.  Your flu test was negative and your COVID test has been sent to the lab. Someone from our office will call you with your results. A prescription was sent for Zithromax. This is an antibiotic used to treat upper respiratory infections. Take as directed.   Return in 2 to 3 days if no improvement. It is very important for you to pay attention to any new symptoms or worsening of your current condition. Please go directly to the Emergency Department immediately should you begin to have any of the following symptoms: shortness of breath, chest pain or difficulty breathing.

## 2022-12-28 NOTE — ED Provider Notes (Signed)
[facility]  Note:  This document was prepared using Dragon voice recognition software and may include unintentional dictation errors.  MRN: [id number] DOB: 28-Jun-1995 DATE: 12/28/22   Subjective:  Chief Complaint:  Chief Complaint  Patient presents with   Sore Throat    Was tested positive for strep throat . After taking provided antibiotics asked instructed , doesn't feel like it's gone away . Or look as if it is . Want to make sure it is exactly strep throat and what I need to do now . - Entered by patient    HPI: Caleb Morrison is a 27 y.o. male presenting for persistent sore throat and cough for about 2.5 weeks. Per his EMR, patient was seen on 12/11/2022 for a sore throat. He was diagnosed with strep at that time and placed on Amoxicillin 500mg  BID. He reports taking the ABX as directed and completing the full 10 days. He reports some improvement when he first started the prescription, but then symptoms returned. He reports ongoing sore throat since starting the ABX. He also reports a new onset cough around the same time he started the ABX. He states he has had a productive cough with nasal congestion since his last visit. He reports no known sick contacts. States he has felt feverish, but no recorded fevers. Denies fever, nausea/vomiting, abdominal pain, otalgia. Endorses cough, sore throat, rhinorrhea, post nasal drip. Presents NAD.  Prior to Admission medications   Not on File     No Known Allergies  History:   Past Medical History:  Diagnosis Date   Rhabdomyolysis      Past Surgical History:  Procedure Laterality Date   ELBOW SURGERY      Family History  Problem Relation Age of Onset   Diabetes Mother    Healthy Father     Social History   Tobacco Use   Smoking status: Every Day    Current packs/day: 0.25    Types: Cigarettes   Smokeless tobacco: Never  Vaping Use   Vaping status: Never Used  Substance Use Topics   Alcohol use: Yes    Comment:  occasionally   Drug use: Not Currently    Types: Marijuana    Review of Systems  Constitutional:  Positive for fatigue. Negative for fever.  HENT:  Positive for congestion, postnasal drip, rhinorrhea and sore throat. Negative for ear pain.   Respiratory:  Positive for cough.   Gastrointestinal:  Negative for abdominal pain, nausea and vomiting.  Neurological:  Positive for headaches.     Objective:   Vitals: BP (!) 157/74 (BP Location: Right Arm)   Pulse 86   Temp 98.4 F (36.9 C) (Oral)   Resp 16   SpO2 95%   Physical Exam Constitutional:      General: He is not in acute distress.    Appearance: Normal appearance. He is well-developed and normal weight. He is not ill-appearing or toxic-appearing.  HENT:     Head: Normocephalic and atraumatic.     Right Ear: Ear canal normal. A middle ear effusion is present.     Left Ear: Ear canal normal. A middle ear effusion is present.     Nose: Rhinorrhea present. Rhinorrhea is clear.     Mouth/Throat:     Pharynx: Uvula midline. Pharyngeal swelling and posterior oropharyngeal erythema present. No oropharyngeal exudate.     Tonsils: No tonsillar exudate or tonsillar abscesses.  Cardiovascular:     Rate and Rhythm: Normal rate and regular rhythm.  Heart sounds: Normal heart sounds.  Pulmonary:     Effort: Pulmonary effort is normal.     Breath sounds: Normal breath sounds.     Comments: Clear to auscultation bilaterally  Abdominal:     General: Bowel sounds are normal.     Palpations: Abdomen is soft.     Tenderness: There is no abdominal tenderness.  Lymphadenopathy:     Cervical: Cervical adenopathy present.     Right cervical: Superficial cervical adenopathy present.     Left cervical: Superficial cervical adenopathy present.  Skin:    General: Skin is warm and dry.  Neurological:     General: No focal deficit present.     Mental Status: He is alert.  Psychiatric:        Mood and Affect: Mood and affect normal.      Results:  Labs: Results for orders placed or performed during the hospital encounter of 12/28/22 (from the past 24 hour(s))  POCT Influenza A/B     Status: None   Collection Time: 12/28/22  9:03 AM  Result Value Ref Range   Influenza A, POC Negative Negative   Influenza B, POC Negative Negative  POCT rapid strep A     Status: None   Collection Time: 12/28/22  9:04 AM  Result Value Ref Range   Rapid Strep A Screen Negative Negative    Radiology: No results found.   UC Course/Treatments:  Procedures: Procedures   Medications Ordered in UC: Medications - No data to display   Assessment and Plan :     ICD-10-CM   1. Acute pharyngitis, unspecified etiology  J02.9 SARS CORONAVIRUS 2 (TAT 6-24 HRS) Anterior Nasal Swab    SARS CORONAVIRUS 2 (TAT 6-24 HRS) Anterior Nasal Swab    2. Acute bronchitis, unspecified organism  J20.9      Acute pharyngitis, unspecified etiology Afebrile, nontoxic-appearing, NAD. VSS. DDX includes but not limited to: strep, mono, viral URI, secondary to post nasal drip Strep and mono were negative today in office. Throat culture is pending. Given continued sore throat with new upper respiratory symptoms and negative strep test, concerned for new infection with different pathogen. Mycoplasma is abduant in the community now so will cover empirically with Zithromax 250mg  as directed. Recommend salt water gargles and over-the-counter throat spray/lozenges. Strict ED precautions were given and patient verbalized understanding.  Acute bronchitis, unspecified organism Afebrile, nontoxic-appearing, NAD. VSS. DDX includes but not limited to: COVID, flu, viral URI, bronchitis, pneumonia Flu was negative today in office. COVID is pending. Given continued sore throat with new upper respiratory symptoms and negative strep test, concerned for new infection with different pathogen. Mycoplasma is abduant in the community now so will cover empirically with Zithromax  250mg  as directed. Patient was also prescribed Benzonatate 200mg  TID PRN for cough. Strict ED precautions were given and patient verbalized understanding.  ED Discharge Orders          Ordered    benzonatate (TESSALON) 200 MG capsule  3 times daily PRN        12/28/22 0856    azithromycin (ZITHROMAX Z-PAK) 250 MG tablet        12/28/22 0907             PDMP not reviewed this encounter.     Cynda Acres, PA-C 12/28/22 [id number]

## 2022-12-28 NOTE — ED Triage Notes (Signed)
Pt continues with sore throat, no improvement with abx, slight HA, feels F intermittently, cough.

## 2022-12-29 LAB — SARS CORONAVIRUS 2 (TAT 6-24 HRS): SARS Coronavirus 2: NEGATIVE

## 2023-01-01 LAB — CULTURE, GROUP A STREP (THRC)

## 2023-01-06 ENCOUNTER — Ambulatory Visit: Payer: Self-pay

## 2023-01-07 ENCOUNTER — Ambulatory Visit
Admission: RE | Admit: 2023-01-07 | Discharge: 2023-01-07 | Disposition: A | Discharge status: Home / Self Care | Payer: BC Managed Care – PPO | Source: Ambulatory Visit | Attending: Internal Medicine | Admitting: Internal Medicine

## 2023-01-07 VITALS — BP 143/80 | HR 86 | Temp 98.6°F | Resp 18

## 2023-01-07 DIAGNOSIS — B370 Candidal stomatitis: Secondary | ICD-10-CM | POA: Diagnosis not present

## 2023-01-07 DIAGNOSIS — R142 Eructation: Secondary | ICD-10-CM | POA: Diagnosis not present

## 2023-01-07 MED ORDER — OMEPRAZOLE 20 MG PO CPDR: 20.0000 mg | DELAYED_RELEASE_CAPSULE | Freq: Every day | ORAL | 1 refills | Status: DC

## 2023-01-07 MED ORDER — NYSTATIN 100000 UNIT/ML MT SUSP: 5.0000 mL | Freq: Four times a day (QID) | OROMUCOSAL | 0 refills | Status: DC

## 2023-01-07 NOTE — ED Triage Notes (Addendum)
Pt states it seems like his tongue is sore and white looking but it is clearing up, soreness in back of throat, burping all day is new symptom, constipation last BM yesterday. Feels like sinus drainage in back of throat. Hasn't picked up famotidine. Denies: F, chills

## 2023-01-07 NOTE — Discharge Instructions (Addendum)
You have been prescribed Omeprazole for your burping. This is a medication often used to lower the acid levels in your stomach. Please take as directed.  You have been prescribed nystatin suspension for your tongue. This is an antifungal often used to treat thrush. Please take as directed.  Please follow-up with your PCP if no improvement for further testing.

## 2023-01-07 NOTE — ED Provider Notes (Signed)
[facility]  Note:  This document was prepared using Dragon voice recognition software and may include unintentional dictation errors.  MRN: [id number] DOB: Mar 10, 1995 DATE: 01/07/23   Subjective:  Chief Complaint:  Chief Complaint  Patient presents with   Follow-up    Was treated for strep throat 2 weeks ago . Was tested again and it was negative . But having problem with constipation and burping non stop all day . And tongue is still white and fungi looking . Need to be checked to make sure its not something else - Entered by patient    HPI: Caleb Morrison is a 27 y.o. male presenting for burping and soreness of his tongue. Patient was seen on 12/11/2022 and diagnosed with strep throat at that time. He was started on Amoxicillin. Patient finished the ABX as directed and returned 17 days later still complaining of a sore throat. Strep was negative at that time as well as throat culture. He was started on Zithromax at that time given ongoing symptoms with no improvement. Today, patient reports sore throat has improved, but he has been burping more. He reports some generalized abdominal pain that appears to have resolved after a bowel movement. He states he has a prescription for Famotidine, but he never picked the RX up at the pharmacy. He also reports some soreness in the back of his throat as well.   Patient also reports continued soreness in his tongue. He feels that it has improved since he was last seen, but has not resolved. He also feels that his tongue is white as well. Patient has a history of smoking, but stopped a week ago. Denies fever, nausea/vomiting, abdominal pain, diarrhea, cough, otalgia. Endorses burping, tongue soreness, tongue discoloration. Presents NAD.  Prior to Admission medications   Not on File     No Known Allergies  History:   Past Medical History:  Diagnosis Date   Rhabdomyolysis      Past Surgical History:  Procedure Laterality Date   ELBOW  SURGERY      Family History  Problem Relation Age of Onset   Diabetes Mother    Healthy Father     Social History   Tobacco Use   Smoking status: Every Day    Current packs/day: 0.25    Types: Cigarettes   Smokeless tobacco: Never  Vaping Use   Vaping status: Never Used  Substance Use Topics   Alcohol use: Yes    Comment: occasionally   Drug use: Not Currently    Types: Marijuana    Review of Systems  Constitutional:  Negative for chills and fever.  HENT:  Positive for postnasal drip and sore throat. Negative for ear pain.   Respiratory:  Negative for cough.   Gastrointestinal:  Negative for abdominal pain, diarrhea, nausea and vomiting.  Skin:  Positive for rash.     Objective:   Vitals: BP (!) 143/80 (BP Location: Right Arm)   Pulse 86   Temp 98.6 F (37 C) (Oral)   Resp 18   SpO2 98%   Physical Exam Constitutional:      General: He is not in acute distress.    Appearance: Normal appearance. He is well-developed and normal weight. He is not ill-appearing or toxic-appearing.  HENT:     Head: Normocephalic and atraumatic.     Right Ear: Ear canal normal. A middle ear effusion is present.     Left Ear: Ear canal normal. A middle ear effusion is present.  Mouth/Throat:     Mouth: Mucous membranes are moist.     Tongue: No lesions. Tongue does not deviate from midline.     Pharynx: Oropharynx is clear. Uvula midline. No pharyngeal swelling, oropharyngeal exudate, posterior oropharyngeal erythema or uvula swelling.     Tonsils: No tonsillar exudate or tonsillar abscesses.     Comments: Tongue has more of a white coating at the base of his tongue.  Cardiovascular:     Rate and Rhythm: Normal rate and regular rhythm.     Heart sounds: Normal heart sounds.  Pulmonary:     Effort: Pulmonary effort is normal.     Breath sounds: Normal breath sounds.     Comments: Clear to auscultation bilaterally  Abdominal:     General: Bowel sounds are normal.      Palpations: Abdomen is soft.     Tenderness: There is no abdominal tenderness.  Skin:    General: Skin is warm and dry.  Neurological:     General: No focal deficit present.     Mental Status: He is alert.  Psychiatric:        Mood and Affect: Mood and affect normal.     Results:  Labs: No results found for this or any previous visit (from the past 24 hour(s)).  Radiology: No results found.   UC Course/Treatments:  Procedures: Procedures   Medications Ordered in UC: Medications - No data to display   Assessment and Plan :     ICD-10-CM   1. Burping  R14.2     2. Oral thrush  B37.0      Burping Afebrile, nontoxic-appearing, NAD. VSS. DDX includes but not limited to: Gastritis, GERD, H. pylori, esophageal spasms Given frequent burping as well as continued slight sore throat, suspect GERD versus gastritis from ABX use. Omeprazole 20mg  every day was prescribed. Recommend follow-up with PCP if no improvement.  Strict ED precautions were given and patient verbalized understanding.  Oral thrush Afebrile, nontoxic-appearing, NAD. VSS. DDX includes but not limited to: Thrush, malignancy, secondary to smoking Given recent ABX use and soreness of his tongue, Nystatin suspension QID was prescribed. Strict ED precautions were given and patient verbalized understanding.  ED Discharge Orders          Ordered    omeprazole (PRILOSEC) 20 MG capsule  Daily        01/07/23 0849    nystatin (MYCOSTATIN) 100000 UNIT/ML suspension  4 times daily        01/07/23 0849             PDMP not reviewed this encounter.     Cynda Acres, PA-C 01/07/23 2956

## 2023-01-10 ENCOUNTER — Telehealth: Payer: Self-pay

## 2023-01-10 MED ORDER — NYSTATIN 100000 UNIT/ML MT SUSP: 5.0000 mL | Freq: Four times a day (QID) | OROMUCOSAL | 0 refills | Status: AC

## 2023-01-10 MED ORDER — NYSTATIN 100000 UNIT/ML MT SUSP: 5.0000 mL | Freq: Four times a day (QID) | OROMUCOSAL | 0 refills | Status: DC

## 2023-01-10 NOTE — Telephone Encounter (Signed)
RX sent for refill of nystatin oral suspension

## 2023-01-17 ENCOUNTER — Ambulatory Visit: Payer: BC Managed Care – PPO

## 2023-01-17 ENCOUNTER — Ambulatory Visit
Admission: RE | Admit: 2023-01-17 | Discharge: 2023-01-17 | Disposition: A | Discharge status: Home / Self Care | Payer: BC Managed Care – PPO | Source: Ambulatory Visit | Attending: Emergency Medicine | Admitting: Emergency Medicine

## 2023-01-17 VITALS — BP 150/59 | HR 60 | Temp 98.0°F | Resp 17

## 2023-01-17 DIAGNOSIS — R1030 Lower abdominal pain, unspecified: Secondary | ICD-10-CM

## 2023-01-17 DIAGNOSIS — R109 Unspecified abdominal pain: Secondary | ICD-10-CM | POA: Diagnosis not present

## 2023-01-17 DIAGNOSIS — K219 Gastro-esophageal reflux disease without esophagitis: Secondary | ICD-10-CM | POA: Diagnosis not present

## 2023-01-17 DIAGNOSIS — K5900 Constipation, unspecified: Secondary | ICD-10-CM | POA: Diagnosis not present

## 2023-01-17 MED ORDER — LANSOPRAZOLE 30 MG PO CPDR: 30.0000 mg | DELAYED_RELEASE_CAPSULE | Freq: Every morning | ORAL | 2 refills | Status: DC

## 2023-01-17 MED ORDER — POLYETHYLENE GLYCOL 3350 17 GM/SCOOP PO POWD: 17.0000 g | Freq: Every morning | ORAL | 2 refills | Status: AC

## 2023-01-17 NOTE — ED Provider Notes (Signed)
[name]    CSN: [id number] Arrival date & time: 01/17/23  1501    HISTORY   Chief Complaint  Patient presents with   Gastroesophageal Reflux   HPI Caleb Morrison is a pleasant, 27 y.o. male who presents to urgent care today. Patient complains of continued pain on the back of his tongue and whiteness on his tongue despite taking omeprazole and using nystatin mouthwash.  Patient states he is also noticed that he has not been able to have a normal bowel movement since he began taking omeprazole.  Patient states he works more than 1 job, has irregular work hours and often eats fast food because he does not have a lot of time to prepare meals for himself.  Patient denies nausea, vomiting, diarrhea, dyspepsia, early satiety.  The history is provided by the patient.   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 Authorizing Provider  nystatin (MYCOSTATIN) 100000 UNIT/ML suspension Use as directed 5 mLs (500,000 Units total) in the mouth or throat 4 (four) times daily for 10 days. Retain in mouth as long as possible. Swish and spit 01/10/23 01/20/23  Hermanns, Ashlee P, PA-C  omeprazole (PRILOSEC) 20 MG capsule Take 1 capsule (20 mg total) by mouth daily. 01/07/23 02/06/23  Hermanns, Annabell Howells, PA-C    Family History Family History  Problem Relation Age of Onset   Diabetes Mother    Healthy Father    Social History Social History   Tobacco Use   Smoking status: Every Day    Current  packs/day: 0.25    Types: Cigarettes   Smokeless tobacco: Never  Vaping Use   Vaping status: Never Used  Substance Use Topics   Alcohol use: Yes    Comment: occasionally   Drug use: Not Currently    Types: Marijuana   Allergies   Patient has no known allergies.  Review of Systems Review of Systems Pertinent findings revealed after performing a 14 point review of systems has been noted in the history of present illness.  Physical Exam Vital Signs BP (!) 150/59 (BP Location: Right Arm)   Pulse 60   Temp 98 F (36.7 C) (Oral)   Resp 17   SpO2 95%   No data found.  Physical Exam Vitals and nursing note reviewed.  Constitutional:      General: He is not in acute distress.    Appearance: Normal appearance. He is normal weight. He is not ill-appearing.  HENT:     Head: Normocephalic and atraumatic.  Eyes:     Extraocular Movements: Extraocular movements intact.     Conjunctiva/sclera: Conjunctivae normal.     Pupils: Pupils are equal, round, and reactive to light.  Cardiovascular:     Rate and Rhythm: Normal rate and regular rhythm.  Pulmonary:     Effort: Pulmonary effort is normal.     Breath sounds: Normal breath sounds.  Abdominal:     General: Abdomen is flat. Bowel sounds are decreased.  Palpations: Abdomen is soft.     Tenderness: There is generalized abdominal tenderness.  Musculoskeletal:        General: Normal range of motion.     Cervical back: Normal range of motion and neck supple.  Skin:    General: Skin is warm and dry.  Neurological:     General: No focal deficit present.     Mental Status: He is alert and oriented to person, place, and time. Mental status is at baseline.  Psychiatric:        Mood and Affect: Mood normal.        Behavior: Behavior normal.        Thought Content: Thought content normal.        Judgment: Judgment normal.     Visual Acuity Right Eye Distance:   Left Eye Distance:   Bilateral Distance:    Right Eye Near:    Left Eye Near:    Bilateral Near:     UC Couse / Diagnostics / Procedures:     Radiology No results found.  Procedures Procedures (including critical care time) EKG  Pending results:  Labs Reviewed - No data to display  Medications Ordered in UC: Medications - No data to display  UC Diagnoses / Final Clinical Impressions(s)   I have reviewed the triage vital signs and the nursing notes.  Pertinent labs & imaging results that were available during my care of the patient were reviewed by me and considered in my medical decision making (see chart for details).    Final diagnoses:  Constipation, unspecified constipation type  Gastroesophageal reflux disease without esophagitis   Images of abdominal x-ray shared with patient.  Recommend magnesium citrate to clear bowels of moderate burden of stool and begin either daily MiraLAX or twice daily fiber supplement to keep bowels moving regularly.  Also recommend avoiding fast food, fatty food, fried food and to add more fresh fruits and vegetables to his diet, drink plenty of water.  Will discontinue omeprazole as it does not seem to be providing him with meaningful relief since he continues to have pain in the back of his throat, begin Prevacid once daily.  Patient encouraged to find a primary care provider by using the QR code at the end of his after visit summary to schedule an appointment.  Conservative care recommended.  Return precautions advised.  Please see discharge instructions below for details of plan of care as provided to patient. ED Prescriptions     Medication Sig Dispense Auth. Provider   lansoprazole (PREVACID) 30 MG capsule Take 1 capsule (30 mg total) by mouth in the morning. 30 minutes prior to breakfast meal 30 capsule Theadora Rama Scales, PA-C   polyethylene glycol powder (MIRALAX) 17 GM/SCOOP powder Take 17 g by mouth in the morning. 510 g Theadora Rama Scales, PA-C      PDMP not reviewed this  encounter.  Pending results:  Labs Reviewed - No data to display    Discharge Instructions      The x-ray of your abdomen today shows that you are constipated.  I recommend that you pick up a bottle of magnesium citrate at the pharmacy, drink the entire bottle when you get home and within 4 hours, you will have have a significant bowel cleanout.  To keep your bowels moving comfortably, I recommend that you either take MiraLAX daily by mixing 1 capful of the powder into 8 ounces of a beverage such as orange juice or water or you  take a fiber supplement once or twice daily.  Be sure that you are eating plenty of fresh fruits and vegetables and drinking plenty of water every day.  Please discontinue omeprazole for relief of heartburn symptoms at this time.  I recommend that you begin taking lansoprazole 30 mg daily going forward.  Please continue to take this daily until you are seen by primary care.  Please use the QR code on the last page of this after visit summary to reach our website to schedule yourself an appointment with a new primary care provider.  Thank you for visiting [facility] today.  We appreciate the opportunity to participate in your care.      Disposition Upon Discharge:  Condition: stable for discharge home  Patient presented with an acute illness with associated systemic symptoms and significant discomfort requiring urgent management. In my opinion, this is a condition that a prudent lay person (someone who possesses an average knowledge of health and medicine) may potentially expect to result in complications if not addressed urgently such as respiratory distress, impairment of bodily function or dysfunction of bodily organs.   Routine symptom specific, illness specific and/or disease specific instructions were discussed with the patient and/or caregiver at length.   As such, the patient has been evaluated and assessed, work-up was performed and  treatment was provided in alignment with urgent care protocols and evidence based medicine.  Patient/parent/caregiver has been advised that the patient may require follow up for further testing and treatment if the symptoms continue in spite of treatment, as clinically indicated and appropriate.  Patient/parent/caregiver has been advised to return to the [facility] or PCP if no better; to PCP or the Emergency Department if new signs and symptoms develop, or if the current signs or symptoms continue to change or worsen for further workup, evaluation and treatment as clinically indicated and appropriate  The patient will follow up with their current PCP if and as advised. If the patient does not currently have a PCP we will assist them in obtaining one.   The patient may need specialty follow up if the symptoms continue, in spite of conservative treatment and management, for further workup, evaluation, consultation and treatment as clinically indicated and appropriate.  Patient/parent/caregiver verbalized understanding and agreement of plan as discussed.  All questions were addressed during visit.  Please see discharge instructions below for further details of plan.  This office note has been dictated using Teaching laboratory technician.  Unfortunately, this method of dictation can sometimes lead to typographical or grammatical errors.  I apologize for your inconvenience in advance if this occurs.  Please do not hesitate to reach out to me if clarification is needed.      Theadora Rama Scales, PA-C 01/17/23 1622

## 2023-01-17 NOTE — ED Triage Notes (Signed)
Pt was seen on multiple times at [facility]. Present today due to symptoms of oral thrush not clearing up. Also c/o not using bathroom or having a normal bowel movement. He is on omepraxole currently.   Symptoms have been going on for over a month

## 2023-01-17 NOTE — Discharge Instructions (Signed)
The x-ray of your abdomen today shows that you are constipated.  I recommend that you pick up a bottle of magnesium citrate at the pharmacy, drink the entire bottle when you get home and within 4 hours, you will have have a significant bowel cleanout.  To keep your bowels moving comfortably, I recommend that you either take MiraLAX daily by mixing 1 capful of the powder into 8 ounces of a beverage such as orange juice or water or you take a fiber supplement once or twice daily.  Be sure that you are eating plenty of fresh fruits and vegetables and drinking plenty of water every day.  Please discontinue omeprazole for relief of heartburn symptoms at this time.  I recommend that you begin taking lansoprazole 30 mg daily going forward.  Please continue to take this daily until you are seen by primary care.  Please use the QR code on the last page of this after visit summary to reach our website to schedule yourself an appointment with a new primary care provider.  Thank you for visiting  Urgent Care today.  We appreciate the opportunity to participate in your care.

## 2023-03-13 ENCOUNTER — Ambulatory Visit: Payer: BC Managed Care – PPO | Admitting: Family Medicine

## 2023-03-29 ENCOUNTER — Ambulatory Visit: Payer: Self-pay

## 2023-03-29 DIAGNOSIS — K5900 Constipation, unspecified: Secondary | ICD-10-CM | POA: Diagnosis not present

## 2023-03-29 DIAGNOSIS — F17210 Nicotine dependence, cigarettes, uncomplicated: Secondary | ICD-10-CM | POA: Diagnosis not present

## 2023-03-29 DIAGNOSIS — R109 Unspecified abdominal pain: Secondary | ICD-10-CM | POA: Diagnosis not present

## 2023-03-29 DIAGNOSIS — R1084 Generalized abdominal pain: Secondary | ICD-10-CM | POA: Diagnosis not present

## 2023-03-29 DIAGNOSIS — R82998 Other abnormal findings in urine: Secondary | ICD-10-CM | POA: Diagnosis not present

## 2023-03-29 DIAGNOSIS — R748 Abnormal levels of other serum enzymes: Secondary | ICD-10-CM | POA: Diagnosis not present

## 2023-03-29 DIAGNOSIS — R1011 Right upper quadrant pain: Secondary | ICD-10-CM | POA: Diagnosis not present

## 2023-03-29 DIAGNOSIS — R252 Cramp and spasm: Secondary | ICD-10-CM | POA: Diagnosis not present

## 2023-04-05 ENCOUNTER — Other Ambulatory Visit: Payer: Self-pay

## 2023-04-05 ENCOUNTER — Ambulatory Visit
Admission: RE | Admit: 2023-04-05 | Discharge: 2023-04-05 | Disposition: A | Discharge status: Home / Self Care | Payer: Self-pay | Source: Ambulatory Visit | Attending: Internal Medicine | Admitting: Internal Medicine

## 2023-04-05 VITALS — BP 150/64 | HR 77 | Temp 98.1°F | Resp 16

## 2023-04-05 DIAGNOSIS — R142 Eructation: Secondary | ICD-10-CM | POA: Diagnosis not present

## 2023-04-05 DIAGNOSIS — Z113 Encounter for screening for infections with a predominantly sexual mode of transmission: Secondary | ICD-10-CM | POA: Insufficient documentation

## 2023-04-05 MED ORDER — LANSOPRAZOLE 30 MG PO CPDR: 30.0000 mg | DELAYED_RELEASE_CAPSULE | Freq: Every morning | ORAL | 2 refills | Status: DC

## 2023-04-05 NOTE — ED Provider Notes (Signed)
 [facility]  Note:  This document was prepared using Dragon voice recognition software and may include unintentional dictation errors.  MRN: [id number] DOB: 10-08-1995 DATE: 04/05/23   Subjective:  Chief Complaint:  Chief Complaint  Patient presents with   SEXUALLY TRANSMITTED DISEASE    Doing a STD check up - Entered by patient     HPI: Caleb Morrison is a 28 y.o. male presenting for STD testing. Patient is currently asymptomatic. Reports no new partners or recent exposures. Patient would like full work up including HIV and RPR. Patient also requesting refill of his Prevacid. Patient reports frequent burping and appears to have history of epigastric pain. He reports that the Prevacid has helped, but burping has returned since running out of the medication about 2 weeks ago. He has appointment with a GI doctor as well as a new PCP. He also reports recent CT scan of his abdomen which was negative. Denies fever, penile discharge, penile lesions, dysuria. Endorses burping. Presents NAD.  Prior to Admission medications   Medication Sig Start Date End Date Taking? Authorizing Provider  lansoprazole (PREVACID) 30 MG capsule Take 1 capsule (30 mg total) by mouth in the morning. 30 minutes prior to breakfast meal 04/05/23 07/04/23  Dangela How P, PA-C  polyethylene glycol powder (MIRALAX) 17 GM/SCOOP powder Take 17 g by mouth in the morning. 01/17/23 04/17/23  Theadora Rama Scales, PA-C     No Known Allergies  History:   Past Medical History:  Diagnosis Date   Rhabdomyolysis      Past Surgical History:  Procedure Laterality Date   ELBOW SURGERY      Family History  Problem Relation Age of Onset   Diabetes Mother    Healthy Father     Social History   Tobacco Use   Smoking status: Every Day    Current packs/day: 0.25    Types: Cigarettes   Smokeless tobacco: Never  Vaping Use   Vaping status: Never Used  Substance Use Topics   Alcohol use: Yes    Comment:  occasionally   Drug use: Not Currently    Types: Marijuana    Review of Systems  Constitutional:  Negative for fever.  Gastrointestinal:  Negative for abdominal pain, blood in stool, nausea and vomiting.  Genitourinary:  Negative for dysuria, genital sores, hematuria, penile discharge, penile pain and testicular pain.     Objective:   Vitals: BP (!) 150/64 (BP Location: Right Arm)   Pulse 77   Temp 98.1 F (36.7 C) (Oral)   Resp 16   SpO2 99%   Physical Exam Constitutional:      General: He is not in acute distress.    Appearance: Normal appearance. He is well-developed and normal weight. He is not ill-appearing or toxic-appearing.  HENT:     Head: Normocephalic and atraumatic.  Cardiovascular:     Rate and Rhythm: Normal rate and regular rhythm.     Heart sounds: Normal heart sounds.  Pulmonary:     Effort: Pulmonary effort is normal.     Breath sounds: Normal breath sounds.     Comments: Clear to auscultation bilaterally  Abdominal:     General: Bowel sounds are normal.     Palpations: Abdomen is soft.     Tenderness: There is no abdominal tenderness. There is no right CVA tenderness or left CVA tenderness.  Skin:    General: Skin is warm and dry.  Neurological:     General: No focal deficit present.  Mental Status: He is alert.  Psychiatric:        Mood and Affect: Mood and affect normal.     Results:  Labs: No results found for this or any previous visit (from the past 24 hours).  Radiology: No results found.   UC Course/Treatments:  Procedures: Procedures   Medications Ordered in UC: Medications - No data to display   Assessment and Plan :     ICD-10-CM   1. Screening for STDs (sexually transmitted diseases)  Z11.3 RPR    HIV Antibody (routine testing w rflx)    HIV4GL Save Tube    RPR    HIV Antibody (routine testing w rflx)    HIV4GL Save Tube    2. Burping  R14.2      Screening for STDs (sexually transmitted diseases) Afebrile,  nontoxic-appearing, NAD. VSS. Cytology, HIV, and RPR are pending.  Currently asymptomatic.  Will wait for results prior to treatment.  Safe sex precautions advised.  Strict ED precautions were given and patient verbalized understanding.  Burping Afebrile, nontoxic-appearing, NAD. VSS. DDX includes but not limited to: GERD, gastritis, h.pylori Refill of lansoprazole 30mg  every day was prescribed. Suspect GERD given improvement with PPI. Recommend he continue with GI follow up as scheduled for further work up. Strict ED precautions were given and patient verbalized understanding.   ED Discharge Orders          Ordered    lansoprazole (PREVACID) 30 MG capsule  Every morning        04/05/23 0846             PDMP not reviewed this encounter.     Cynda Acres, PA-C 04/05/23 1610

## 2023-04-05 NOTE — ED Triage Notes (Signed)
"   I am here for a STD checkup " Patient denies exposure or symptoms.

## 2023-04-05 NOTE — Discharge Instructions (Signed)
 Your swab and blood work were sent to the lab for further testing.  You will be called with results. You should avoid all sexual activity until you have been notified of all your results and have undergone any necessary treatment.  If you are positive, it is recommended that you inform all sexual partners so they can treat be treated as well before having sex again.

## 2023-04-06 ENCOUNTER — Encounter: Payer: Self-pay | Admitting: Emergency Medicine

## 2023-04-06 ENCOUNTER — Ambulatory Visit: Payer: BC Managed Care – PPO

## 2023-04-06 ENCOUNTER — Telehealth: Payer: Self-pay | Admitting: Emergency Medicine

## 2023-04-06 LAB — HIV ANTIBODY (ROUTINE TESTING W REFLEX): HIV Screen 4th Generation wRfx: NONREACTIVE

## 2023-04-06 LAB — CYTOLOGY, (ORAL, ANAL, URETHRAL) ANCILLARY ONLY
Chlamydia: NEGATIVE
Comment: NEGATIVE
Comment: NEGATIVE
Comment: NORMAL
Neisseria Gonorrhea: NEGATIVE
Trichomonas: POSITIVE — AB

## 2023-04-06 LAB — RPR: RPR Ser Ql: NONREACTIVE

## 2023-04-06 MED ORDER — METRONIDAZOLE 500 MG PO TABS: 2000.0000 mg | ORAL_TABLET | Freq: Once | ORAL | 0 refills | Status: AC

## 2023-04-06 NOTE — Telephone Encounter (Signed)
 Patient tested positive for trichomonas.  Metronidazole 2 g OTO sent to pharmacy.

## 2023-04-09 ENCOUNTER — Ambulatory Visit
Admission: RE | Admit: 2023-04-09 | Discharge: 2023-04-09 | Disposition: A | Discharge status: Home / Self Care | Source: Ambulatory Visit | Attending: Internal Medicine | Admitting: Internal Medicine

## 2023-04-09 ENCOUNTER — Other Ambulatory Visit: Payer: Self-pay

## 2023-04-09 VITALS — BP 159/96 | HR 85 | Temp 98.7°F | Resp 16

## 2023-04-09 DIAGNOSIS — Z8619 Personal history of other infectious and parasitic diseases: Secondary | ICD-10-CM | POA: Diagnosis not present

## 2023-04-09 DIAGNOSIS — R6883 Chills (without fever): Secondary | ICD-10-CM

## 2023-04-09 DIAGNOSIS — R030 Elevated blood-pressure reading, without diagnosis of hypertension: Secondary | ICD-10-CM

## 2023-04-09 NOTE — ED Triage Notes (Signed)
 Patient concerned that he still has an STD. Patient states C/O pain left index finger and  wants "spots" on his back checked. Patient states "lymph node on right side of his neck needs to be checked out"

## 2023-04-09 NOTE — ED Provider Notes (Signed)
 [facility]  Note:  This document was prepared using Dragon voice recognition software and may include unintentional dictation errors.  MRN: [id number] DOB: 06-14-1995 DATE: 04/09/23   Subjective:  Chief Complaint:  Chief Complaint  Patient presents with   SEXUALLY TRANSMITTED DISEASE    Chills in hand and soreness around index finger. Was treated for trich, want to get retested for hiv and trich. Also been seen for anything abnormal . - Entered by patient   Exposure to STD     HPI: Caleb Morrison is a 28 y.o. male presenting for recheck. Patient was seen on 04/05/2023 for STD testing. Patient was asymptomatic at that time, but presented for full STD testing. He tested positive for Trichomoniasis at that time and completed the Flagyl 2g once. HIV and RPR were negative. He presents today anxious about his results. He would like repeat testing. He states he has not had sex since 04/04/2023 and used protection at that time. He was surprised by his results and is concerned that he may have HIV as well and tested to early. He reports history of unprotected sex, but the last few encounters he has used protection. He reports no IV drug use and only has sex with women. Patient is currently asymptomatic except for chills "in his hands" last night while at work. He reports googling a lot of HIV symptoms. Of note, he has never tested positive for HIV and is tested frequently. He appears to often tested the next day following a sexual encounter. Denies fever, nausea/vomiting, fatigue. Endorses chills. Presents NAD.  Prior to Admission medications   Medication Sig Start Date End Date Taking? Authorizing Provider  lansoprazole (PREVACID) 30 MG capsule Take 1 capsule (30 mg total) by mouth in the morning. 30 minutes prior to breakfast meal 04/05/23 07/04/23  Alto Gandolfo P, PA-C  polyethylene glycol powder (MIRALAX) 17 GM/SCOOP powder Take 17 g by mouth in the morning. 01/17/23 04/17/23  Theadora Rama  Scales, PA-C     No Known Allergies  History:   Past Medical History:  Diagnosis Date   Rhabdomyolysis      Past Surgical History:  Procedure Laterality Date   ELBOW SURGERY      Family History  Problem Relation Age of Onset   Diabetes Mother    Healthy Father     Social History   Tobacco Use   Smoking status: Every Day    Current packs/day: 0.25    Types: Cigarettes   Smokeless tobacco: Never  Vaping Use   Vaping status: Never Used  Substance Use Topics   Alcohol use: Yes    Comment: occasionally   Drug use: Not Currently    Types: Marijuana    Review of Systems  Constitutional:  Positive for chills. Negative for fatigue and fever.  Respiratory:  Negative for shortness of breath.   Cardiovascular:  Negative for chest pain.  Gastrointestinal:  Negative for nausea and vomiting.  Musculoskeletal:  Negative for arthralgias and myalgias.  Psychiatric/Behavioral:  The patient is nervous/anxious.      Objective:   Vitals: BP (!) 159/96 (BP Location: Right Arm)   Pulse 85   Temp 98.7 F (37.1 C) (Oral)   Resp 16   SpO2 97%   Physical Exam Constitutional:      General: He is not in acute distress.    Appearance: Normal appearance. He is well-developed and normal weight. He is not ill-appearing or toxic-appearing.  HENT:     Head: Normocephalic and atraumatic.  Neck:     Comments: Lymph node palpated on right side of neck. Does not appear enlarged or tender.  Cardiovascular:     Rate and Rhythm: Normal rate and regular rhythm.     Heart sounds: Normal heart sounds.  Pulmonary:     Effort: Pulmonary effort is normal.     Breath sounds: Normal breath sounds.     Comments: Clear to auscultation bilaterally  Abdominal:     General: Bowel sounds are normal.     Palpations: Abdomen is soft.     Tenderness: There is no abdominal tenderness.  Skin:    General: Skin is warm and dry.  Neurological:     General: No focal deficit present.     Mental  Status: He is alert.  Psychiatric:        Mood and Affect: Affect normal. Mood is anxious.     Results:  Labs: No results found for this or any previous visit (from the past 24 hours).  Radiology: No results found.   UC Course/Treatments:  Procedures: Procedures   Medications Ordered in UC: Medications - No data to display   Assessment and Plan :     ICD-10-CM   1. Chills  R68.83 Comprehensive metabolic panel    CBC with Differential/Platelet    Comprehensive metabolic panel    CBC with Differential/Platelet    2. History of trichomoniasis  Z86.19     3. Elevated blood pressure reading in office without diagnosis of hypertension  R03.0       Chills Afebrile, nontoxic-appearing, NAD. VSS. DDX includes but not limited to: COVID, flu, viral etiology, hypothyroidism, malignancy, anemia Numerous differentials at this time. CBC and CMP are pending to evaluate WBC, HCT, and organ function. Recommend follow up with PCP next month for CPE. Strict ED precautions were given and patient verbalized understanding.  Elevated Blood Pressure Reading in Office Without Diagnosis of Hypertension Afebrile, nontoxic-appearing, NAD. VSS. DDX includes but not limited to: HTN, white coat, anxiety BP elevated at his visit. Patient does endorse anxiety. Difficult to determine at this point if underlying HTN versus white coat versus secondary to medical anxiety.  He was instructed to monitor his BP daily and follow up with our office or PCP sooner if persistently >140/90. Strict ED precautions were given and patient verbalized understanding.  History of trichomoniasis Afebrile, nontoxic-appearing, NAD. VSS. Previously diagnosed. Completed treatment. Recommend repeat in 3-4 weeks. No repeat testing indicated at this time. Strict ED precautions were given and patient verbalized understanding.   ED Discharge Orders     None        PDMP not reviewed this encounter.     Cynda Acres,  PA-C 04/09/23 1207

## 2023-04-09 NOTE — Discharge Instructions (Signed)
 Your blood work was sent to the lab for further testing. Someone from our office will call you with your results. I recommend you follow up with your PCP for complete physical and blood work.

## 2023-04-10 LAB — COMPREHENSIVE METABOLIC PANEL
ALT: 18 IU/L (ref 0–44)
AST: 21 IU/L (ref 0–40)
Albumin: 4.7 g/dL (ref 4.3–5.2)
Alkaline Phosphatase: 57 IU/L (ref 44–121)
BUN/Creatinine Ratio: 12 (ref 9–20)
BUN: 13 mg/dL (ref 6–20)
Bilirubin Total: 0.4 mg/dL (ref 0.0–1.2)
CO2: 23 mmol/L (ref 20–29)
Calcium: 9.6 mg/dL (ref 8.7–10.2)
Chloride: 104 mmol/L (ref 96–106)
Creatinine, Ser: 1.07 mg/dL (ref 0.76–1.27)
Globulin, Total: 2.2 g/dL (ref 1.5–4.5)
Glucose: 112 mg/dL — ABNORMAL HIGH (ref 70–99)
Potassium: 4.5 mmol/L (ref 3.5–5.2)
Sodium: 140 mmol/L (ref 134–144)
Total Protein: 6.9 g/dL (ref 6.0–8.5)
eGFR: 96 mL/min/{1.73_m2} (ref >59)

## 2023-04-10 LAB — CBC WITH DIFFERENTIAL/PLATELET
Basophils Absolute: 0.1 10*3/uL (ref 0.0–0.2)
Basos: 1 %
EOS (ABSOLUTE): 0 10*3/uL (ref 0.0–0.4)
Eos: 1 %
Hematocrit: 46.6 % (ref 37.5–51.0)
Hemoglobin: 15 g/dL (ref 13.0–17.7)
Immature Grans (Abs): 0 10*3/uL (ref 0.0–0.1)
Immature Granulocytes: 0 %
Lymphocytes Absolute: 1.8 10*3/uL (ref 0.7–3.1)
Lymphs: 35 %
MCH: 31.1 pg (ref 26.6–33.0)
MCHC: 32.2 g/dL (ref 31.5–35.7)
MCV: 97 fL (ref 79–97)
Monocytes Absolute: 0.4 10*3/uL (ref 0.1–0.9)
Monocytes: 8 %
Neutrophils Absolute: 2.7 10*3/uL (ref 1.4–7.0)
Neutrophils: 55 %
Platelets: 344 10*3/uL (ref 150–450)
RBC: 4.83 x10E6/uL (ref 4.14–5.80)
RDW: 11.8 % (ref 11.6–15.4)
WBC: 5.1 10*3/uL (ref 3.4–10.8)

## 2023-04-11 ENCOUNTER — Encounter: Payer: Self-pay | Admitting: Family Medicine

## 2023-04-11 ENCOUNTER — Ambulatory Visit: Admitting: Family Medicine

## 2023-04-11 VITALS — BP 135/82 | HR 72 | Temp 98.7°F | Resp 18 | Ht 74.0 in | Wt 233.1 lb

## 2023-04-11 DIAGNOSIS — Z7689 Persons encountering health services in other specified circumstances: Secondary | ICD-10-CM | POA: Diagnosis not present

## 2023-04-11 DIAGNOSIS — Z8619 Personal history of other infectious and parasitic diseases: Secondary | ICD-10-CM | POA: Insufficient documentation

## 2023-04-11 DIAGNOSIS — K219 Gastro-esophageal reflux disease without esophagitis: Secondary | ICD-10-CM | POA: Diagnosis not present

## 2023-04-11 NOTE — Assessment & Plan Note (Signed)
 Completed prescribed treatment last Friday. Will test for cure at CPE visit. Recommend consistent condom use.

## 2023-04-11 NOTE — Progress Notes (Signed)
 New Patient Office Visit  Subjective    Patient ID: Caleb Morrison, male    DOB: [birth year]  Age: 28 y.o. MRN: [id number]  CC:  Chief Complaint  Patient presents with   Establish Care    Patient is here to establish care with a new PCP, Patient states that he has been issues with indigestion for the past 2-3 months after he was treated for strep throat. Patient states that it was hard for him to swallow and he was put on prevacid but states that now he has issues with constant burping    HPI Caleb Morrison presents to establish care with this practice. He is new to me. Having issues with burping after strep throat treatment mid November. Digestive health changed after this treatment. Completed treatment last Friday for trich Will test at CPE visit.  GI appointment tomorrow with [facility] Specialist.  Recommend asking GI about testing for h-pylori. Taking prevacid 30 mg, helping with the burping.  Stopped smoking mid November. Eating more salads, not too many raw foods.  Only eats twice per day, smaller meals now. Stopped eating greasy foods now.  Talking Miralax daily, having BMs daily. No fever or  chills. No nausea or vomiting.      Outpatient Encounter Medications as of 04/11/2023  Medication Sig   lansoprazole (PREVACID) 30 MG capsule Take 1 capsule (30 mg total) by mouth in the morning. 30 minutes prior to breakfast meal   polyethylene glycol powder (MIRALAX) 17 GM/SCOOP powder Take 17 g by mouth in the morning.   [DISCONTINUED] metroNIDAZOLE (FLAGYL) 500 MG tablet Take 500 mg by mouth 2 (two) times daily.   sucralfate (CARAFATE) 1 g tablet Take 1 g by mouth 2 (two) times daily. (Patient not taking: Reported on 04/11/2023)   No facility-administered encounter medications on file as of 04/11/2023.    Past Medical History:  Diagnosis Date   Rhabdomyolysis    Trichomoniasis     Past Surgical History:  Procedure Laterality Date   ELBOW SURGERY      Family History   Problem Relation Age of Onset   Diabetes Mother    Healthy Father     Social History   Socioeconomic History   Marital status: Single    Spouse name: Not on file   Number of children: Not on file   Years of education: Not on file   Highest education level: Not on file  Occupational History   Occupation: Landscaper  Tobacco Use   Smoking status: Former    Current packs/day: 0.25    Types: Cigarettes    Passive exposure: Past   Smokeless tobacco: Never  Vaping Use   Vaping status: Some Days  Substance and Sexual Activity   Alcohol use: Yes    Comment: occasionally   Drug use: Not Currently    Types: Marijuana   Sexual activity: Yes    Birth control/protection: Condom  Other Topics Concern   Not on file  Social History Narrative   Not on file   Social Drivers of Health   Financial Resource Strain: Not on file  Food Insecurity: Not on file  Transportation Needs: Not on file  Physical Activity: Not on file  Stress: Not on file  Social Connections: Unknown (06/21/2021)   Received from [facility]   Social Network    Social Network: Not on file  Intimate Partner Violence: Not At Risk (03/29/2023)   Received [facility]   HITS  Over the last 12 months how often did your partner physically hurt you?: Never    Over the last 12 months how often did your partner insult you or talk down to you?: Never    Over the last 12 months how often did your partner threaten you with physical harm?: Never    Over the last 12 months how often did your partner scream or curse at you?: Never    ROS      Objective    BP 135/82   Pulse 72   Temp 98.7 F (37.1 C) (Oral)   Resp 18   Ht 6\' 2"  (1.88 m)   Wt 233 lb 1.6 oz (105.7 kg)   SpO2 99%   BMI 29.93 kg/m   Physical Exam Vitals and nursing note reviewed.  Constitutional:      Appearance: Normal appearance. He is normal weight.  Cardiovascular:     Rate and Rhythm: Regular rhythm.     Heart  sounds: Normal heart sounds.  Pulmonary:     Effort: Pulmonary effort is normal.     Breath sounds: Normal breath sounds.  Skin:    General: Skin is warm and dry.  Neurological:     General: No focal deficit present.     Mental Status: He is alert. Mental status is at baseline.  Psychiatric:        Mood and Affect: Mood normal.        Behavior: Behavior normal.        Thought Content: Thought content normal.        Judgment: Judgment normal.        Assessment & Plan:   Problem List Items Addressed This Visit     Establishing care with new doctor, encounter for - Primary   Gastroesophageal reflux disease   Taking prevacid 30 mg daily as prescribed. Continues to have excessive burping. Has appointment with GI specialist tomorrow for evaluation.  Recommend asking specialist if h-pylori testing is appropriate.       Relevant Medications   sucralfate (CARAFATE) 1 g tablet   History of trichomoniasis   Completed prescribed treatment last Friday. Will test for cure at CPE visit. Recommend consistent condom use.      Agrees with plan of care discussed.  Questions answered.  Total face to face time: 30 minutes discussing concerns with digestive health.   Return in about 4 weeks (around 05/09/2023) for CPE with labs.   Novella Olive, FNP

## 2023-04-11 NOTE — Assessment & Plan Note (Signed)
 Taking prevacid 30 mg daily as prescribed. Continues to have excessive burping. Has appointment with GI specialist tomorrow for evaluation.  Recommend asking specialist if h-pylori testing is appropriate.

## 2023-04-12 ENCOUNTER — Ambulatory Visit (HOSPITAL_COMMUNITY): Payer: Self-pay

## 2023-04-12 DIAGNOSIS — R1310 Dysphagia, unspecified: Secondary | ICD-10-CM | POA: Diagnosis not present

## 2023-04-12 DIAGNOSIS — R1084 Generalized abdominal pain: Secondary | ICD-10-CM | POA: Diagnosis not present

## 2023-04-12 DIAGNOSIS — K5900 Constipation, unspecified: Secondary | ICD-10-CM | POA: Diagnosis not present

## 2023-04-12 DIAGNOSIS — R12 Heartburn: Secondary | ICD-10-CM | POA: Diagnosis not present

## 2023-04-12 DIAGNOSIS — M791 Myalgia, unspecified site: Secondary | ICD-10-CM | POA: Diagnosis not present

## 2023-04-12 DIAGNOSIS — R200 Anesthesia of skin: Secondary | ICD-10-CM | POA: Diagnosis not present

## 2023-04-12 DIAGNOSIS — N342 Other urethritis: Secondary | ICD-10-CM | POA: Diagnosis not present

## 2023-04-12 DIAGNOSIS — Z79899 Other long term (current) drug therapy: Secondary | ICD-10-CM | POA: Diagnosis not present

## 2023-04-12 DIAGNOSIS — R809 Proteinuria, unspecified: Secondary | ICD-10-CM | POA: Diagnosis not present

## 2023-04-12 DIAGNOSIS — F17290 Nicotine dependence, other tobacco product, uncomplicated: Secondary | ICD-10-CM | POA: Diagnosis not present

## 2023-04-12 DIAGNOSIS — M6282 Rhabdomyolysis: Secondary | ICD-10-CM | POA: Diagnosis not present

## 2023-04-16 ENCOUNTER — Encounter (HOSPITAL_COMMUNITY): Payer: Self-pay

## 2023-04-16 ENCOUNTER — Ambulatory Visit (HOSPITAL_COMMUNITY)
Admission: RE | Admit: 2023-04-16 | Discharge: 2023-04-16 | Disposition: A | Discharge status: Home / Self Care | Payer: Self-pay | Source: Ambulatory Visit

## 2023-04-16 VITALS — BP 139/74 | HR 69 | Temp 98.0°F | Resp 16 | Ht 74.0 in | Wt 233.0 lb

## 2023-04-16 DIAGNOSIS — Z113 Encounter for screening for infections with a predominantly sexual mode of transmission: Secondary | ICD-10-CM | POA: Diagnosis not present

## 2023-04-16 LAB — HIV ANTIBODY (ROUTINE TESTING W REFLEX): HIV Screen 4th Generation wRfx: NONREACTIVE

## 2023-04-16 NOTE — ED Provider Notes (Signed)
 [facility]  Note:  This document was prepared using Dragon voice recognition software and may include unintentional dictation errors.  MRN: [id number] DOB: [birth year]  Subjective:   Caleb Morrison is a 28 y.o. male presenting for routine STD screening.  Patient denies any current symptoms.  Patient reports that he was recently diagnosed with trichomonas and wants to make sure that that infection is cleared.  Patient denies any penile discharge, burning with urination, increased urinary frequency, abdominal pain, flank pain.  Patient also reports that he would like to have screening for HIV.  Patient reports that he does practice safe sex but wants to make sure that he does not have any active infections.  No current facility-administered medications for this encounter.  Current Outpatient Medications:    lansoprazole (PREVACID) 30 MG capsule, Take 1 capsule (30 mg total) by mouth in the morning. 30 minutes prior to breakfast meal, Disp: 30 capsule, Rfl: 2   polyethylene glycol powder (MIRALAX) 17 GM/SCOOP powder, Take 17 g by mouth in the morning., Disp: 510 g, Rfl: 2   No Known Allergies  Past Medical History:  Diagnosis Date   Rhabdomyolysis    Trichomoniasis      Past Surgical History:  Procedure Laterality Date   ELBOW SURGERY      Family History  Problem Relation Age of Onset   Diabetes Mother    Healthy Father     Social History   Tobacco Use   Smoking status: Former    Current packs/day: 0.25    Types: Cigarettes    Passive exposure: Past   Smokeless tobacco: Never  Vaping Use   Vaping status: Some Days  Substance Use Topics   Alcohol use: Yes    Comment: occasionally   Drug use: Not Currently    Types: Marijuana    ROS Refer to HPI for ROS details.  Objective:   Vitals: BP 139/74 (BP Location: Left Arm)   Pulse 69   Temp 98 F (36.7 C) (Oral)   Resp 16   Ht 6\' 2"  (1.88 m)   Wt 233 lb (105.7 kg)   SpO2 97%   BMI 29.92 kg/m    Physical Exam Vitals and nursing note reviewed.  Constitutional:      General: He is not in acute distress.    Appearance: Normal appearance. He is well-developed and normal weight. He is not ill-appearing or toxic-appearing.  HENT:     Head: Normocephalic.  Cardiovascular:     Rate and Rhythm: Normal rate.  Pulmonary:     Effort: Pulmonary effort is normal. No respiratory distress.  Abdominal:     Palpations: Abdomen is soft.     Tenderness: There is no abdominal tenderness. There is no right CVA tenderness, left CVA tenderness, guarding or rebound.  Skin:    General: Skin is warm and dry.     Capillary Refill: Capillary refill takes less than 2 seconds.  Neurological:     General: No focal deficit present.     Mental Status: He is alert and oriented to person, place, and time.  Psychiatric:        Mood and Affect: Mood normal.     Procedures  No results found for this or any previous visit (from the past 24 hours).  Assessment and Plan :   PDMP not reviewed this encounter.  1. Screen for STD (sexually transmitted disease)     Screening for STDs -Laboratory testing initiated for gonorrhea/chlamydia, trichomonas, HSV 1 and 2,  serum HIV, serum RPR. -Test results should be available in the next 24 to 48 hours if any abnormality noted you will be contacted and appropriate treatment provided. -If you have any further medical concerns follow-up in urgent care or go to the ER for further evaluation management.  Lucky Cowboy   [place], [place], [place] 04/16/23 [phone]

## 2023-04-16 NOTE — ED Triage Notes (Signed)
 Patient here today to be tested for all Stds.

## 2023-04-16 NOTE — Discharge Instructions (Signed)
 Screening for STDs -Laboratory testing initiated for gonorrhea/chlamydia, trichomonas, HSV 1 and 2, serum HIV, serum RPR. -Test results should be available in the next 24 to 48 hours if any abnormality noted you will be contacted and appropriate treatment provided. -If you have any further medical concerns follow-up in urgent care or go to the ER for further evaluation management.

## 2023-04-17 ENCOUNTER — Encounter (HOSPITAL_COMMUNITY): Payer: Self-pay

## 2023-04-17 DIAGNOSIS — A599 Trichomoniasis, unspecified: Secondary | ICD-10-CM

## 2023-04-17 LAB — CYTOLOGY, (ORAL, ANAL, URETHRAL) ANCILLARY ONLY
Chlamydia: NEGATIVE
Comment: NEGATIVE
Comment: NEGATIVE
Comment: NORMAL
Neisseria Gonorrhea: NEGATIVE
Trichomonas: POSITIVE — AB

## 2023-04-17 LAB — RPR: RPR Ser Ql: NONREACTIVE

## 2023-04-17 MED ORDER — METRONIDAZOLE 500 MG PO TABS: 2000.0000 mg | ORAL_TABLET | Freq: Once | ORAL | 0 refills | Status: AC

## 2023-04-17 NOTE — Telephone Encounter (Signed)
-----   Message from Nurse Harlow Ohms sent at 04/17/2023  4:47 PM EDT ----- Please advise if pt needs to be retreated for Trichomonas. Asymptomatic. Treated on 2/28. This was test for cure. Thanks

## 2023-04-18 DIAGNOSIS — K219 Gastro-esophageal reflux disease without esophagitis: Secondary | ICD-10-CM | POA: Diagnosis not present

## 2023-04-18 DIAGNOSIS — K3189 Other diseases of stomach and duodenum: Secondary | ICD-10-CM | POA: Diagnosis not present

## 2023-04-22 ENCOUNTER — Ambulatory Visit
Admission: RE | Admit: 2023-04-22 | Discharge: 2023-04-22 | Discharge status: Home / Self Care | Payer: Self-pay | Source: Ambulatory Visit | Attending: Physician Assistant

## 2023-04-22 VITALS — BP 146/83 | HR 85 | Temp 98.8°F | Resp 15

## 2023-04-22 DIAGNOSIS — M6282 Rhabdomyolysis: Secondary | ICD-10-CM | POA: Insufficient documentation

## 2023-04-22 DIAGNOSIS — Z8619 Personal history of other infectious and parasitic diseases: Secondary | ICD-10-CM | POA: Insufficient documentation

## 2023-04-22 DIAGNOSIS — R109 Unspecified abdominal pain: Secondary | ICD-10-CM | POA: Insufficient documentation

## 2023-04-22 DIAGNOSIS — N179 Acute kidney failure, unspecified: Secondary | ICD-10-CM | POA: Insufficient documentation

## 2023-04-22 LAB — POCT URINALYSIS DIP (MANUAL ENTRY)
Bilirubin, UA: NEGATIVE
Blood, UA: NEGATIVE
Glucose, UA: NEGATIVE mg/dL
Ketones, POC UA: NEGATIVE mg/dL
Leukocytes, UA: NEGATIVE
Nitrite, UA: NEGATIVE
Protein Ur, POC: NEGATIVE mg/dL
Spec Grav, UA: 1.02 (ref 1.010–1.025)
Urobilinogen, UA: 0.2 U/dL
pH, UA: 7 (ref 5.0–8.0)

## 2023-04-22 NOTE — Discharge Instructions (Signed)
 At this time I am concerned that your muscle aches may be secondary to your recovery from rhabdomyolysis.  Rhabdomyolysis typically causes breakdown of your muscles all over your body and it can take some time to recover from this.  Please make sure that you are drinking plenty of water, at least 75 ounces of water per day.  I recommend implementing at least 1 electrolyte replacement beverage per day such as sugar-free Gatorade, Pedialyte or liquid IV per your preference.  You can take Tylenol as needed for body aches and you can use Biofreeze or Voltaren gel.  I do not recommend taking NSAIDs until we are sure that your kidney function has returned to normal baseline.  We will retest you for trichomonas.  We will keep you updated with those results.  If you are still positive I recommend a weeklong regimen instead of a single dose to help with definitive resolution.  If your lab work appears like it is returning back to normal I do recommend following up with your primary care provider.  You may need further testing as well as potential referrals to physical therapy for ongoing body aches.

## 2023-04-22 NOTE — ED Triage Notes (Signed)
 Pt states he was treated for trichomoniasis. At the end of January pt started having pain RLQ and pain that radiated from his right rib cage into his flank and right hip.   In the past two to three weeks his pain has increased. He has also had neck stiffness and body aches including his back pain and shoulder pain.  Home Interventions: None

## 2023-04-22 NOTE — ED Provider Notes (Addendum)
 [facility]    CSN: [id number] Arrival date & time: 04/22/23  0825      History   Chief Complaint Chief Complaint  Patient presents with   Abdominal Pain    Was treated for trich twice due to it not going away the first time . And now about two weeks later having bad abdominal pain near right lower stomach area to my hip and thigh . Hard to sleep because it's tight and painful to lay on my side - Entered by patient    HPI [facility] is a 28 y.o. male.   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  He also reports that he has had neck stiffness and bodyaches that have been increasing over the past 2 to 3 weeks.  He states he has been having several issues since Nov He states he developed indigestion since he was treated for strep in Nov - he has established with PCP and is being seen by GI   He developed rhabdo and was treated for this at ED on 04/11/33  He has been treated for Trichomonas about 2 times- most recently treated last Tuesday  He reports he is having body aches and pains and for the past week or so he has stopped exercising but body aches have persisted He states last night he was having pain and could not sleep on his right side  He would like to be retested for trichomonas today but is concerned for persistent body aches    He reports his urine has  been a bit darker the past few days but admits he has not been drinking as much water the past two days    Chart review: On 04/12/2023 patient was treated for nontraumatic rhabdomyolysis in the emergency room.  He is also undergoing workup with GI for his abdominal pain.  Most recently on 04/18/2023 he had EGD performed but I am not able to see the results of this testing.   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 Authorizing Provider  FIBERCON 625 MG tablet Take by mouth. 04/12/23 04/11/24 Yes [provider]  lansoprazole (PREVACID) 30 MG capsule Take 1 capsule (30 mg total) by mouth in the morning. 30 minutes prior to breakfast meal 04/05/23 07/04/23 Yes Hermanns, Ashlee P, PA-C  metroNIDAZOLE (FLAGYL) 500 MG tablet Take by mouth 2 (two) times daily. 04/17/23  Yes [provider]    Family History Family History  Problem Relation Age of Onset   Diabetes Mother    Healthy Father    Healthy Brother     Social History Social History   Tobacco Use   Smoking status: Some Days    Current packs/day: 0.25    Types:  Cigarettes, Cigars    Passive exposure: Past   Smokeless tobacco: Never  Vaping Use   Vaping status: Some Days  Substance Use Topics   Alcohol use: Yes    Comment: occasionally   Drug use: Not Currently    Types: Marijuana     Allergies   Patient has no known allergies.   Review of Systems Review of Systems  Constitutional:  Negative for chills and fever.  Gastrointestinal:  Positive for abdominal pain. Negative for diarrhea, nausea and vomiting.  Musculoskeletal:  Positive for myalgias.     Physical Exam Triage Vital Signs ED Triage Vitals  Encounter Vitals Group     BP 04/22/23 0829 (!) 146/83     Systolic BP Percentile --      Diastolic BP Percentile --      Pulse Rate 04/22/23 0829 85     Resp 04/22/23 0829 15     Temp  04/22/23 0829 98.8 F (37.1 C)     Temp Source 04/22/23 0829 Oral     SpO2 04/22/23 0829 93 %     Weight --      Height --      Head Circumference --      Peak Flow --      Pain Score 04/22/23 0838 7     Pain Loc --      Pain Education --      Exclude from Growth Chart --    No data found.  Updated Vital Signs BP (!) 146/83 (BP Location: Right Arm)   Pulse 85   Temp 98.8 F (37.1 C) (Oral)   Resp 15   SpO2 93%   Visual Acuity Right Eye Distance:   Left Eye Distance:   Bilateral Distance:    Right Eye Near:   Left Eye Near:    Bilateral Near:     Physical Exam Vitals reviewed.  Constitutional:      General: He is awake.     Appearance: Normal appearance. He is well-developed and well-groomed.  HENT:     Head: Normocephalic and atraumatic.  Cardiovascular:     Rate and Rhythm: Normal rate and regular rhythm.     Pulses:          Radial pulses are 2+ on the right side and 2+ on the left side.     Heart sounds: Normal heart sounds. No murmur heard.    No friction rub. No gallop.  Pulmonary:     Effort: Pulmonary effort is normal.     Breath sounds: Normal breath sounds. No decreased air movement. No decreased breath sounds, wheezing, rhonchi or rales.  Abdominal:     General: Abdomen is flat. Bowel sounds are normal.     Palpations: Abdomen is soft.     Tenderness: There is no abdominal tenderness. There is no right CVA tenderness or left CVA tenderness.  Neurological:     General: No focal deficit present.     Mental Status: He is alert and oriented to person, place, and time.     GCS: GCS eye subscore is 4. GCS verbal subscore is 5. GCS motor subscore is 6.     Cranial Nerves: No cranial nerve deficit, dysarthria or facial asymmetry.  Psychiatric:        Attention and Perception: Attention and perception normal.        Mood and Affect: Mood and affect normal.        Speech: Speech normal.  Behavior: Behavior normal. Behavior is cooperative.       UC Treatments / Results  Labs (all labs ordered are listed, but only abnormal results are displayed) Labs Reviewed  CBC WITH DIFFERENTIAL/PLATELET  COMPREHENSIVE METABOLIC PANEL  CK  POCT URINALYSIS DIP (MANUAL ENTRY)  CYTOLOGY, (ORAL, ANAL, URETHRAL) ANCILLARY ONLY    EKG   Radiology No results found.  Procedures Procedures (including critical care time)  Medications Ordered in UC Medications - No data to display  Initial Impression / Assessment and Plan / UC Course  I have reviewed the triage vital signs and the nursing notes.  Pertinent labs & imaging results that were available during my care of the patient were reviewed by me and considered in my medical decision making (see chart for details).    Urine dip was negative for signs of infection, hematuria or protein, ketones.  Final Clinical Impressions(s) / UC Diagnoses   Final diagnoses:  Acute renal failure, unspecified acute renal failure type (HCC)  Non-traumatic rhabdomyolysis  History of trichomoniasis    Patient presents today with concerns for generalized bodyaches, right sided flank pain that radiates into the abdomen particularly the right lower quadrant.  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  He was also seen and treated in the emergency room 04/12/2023 for nontraumatic rhabdomyolysis.  At this time I am concerned that his symptoms may be secondary to recovery from rhabdomyolysis or potentially even recurrent rhabdomyolysis.  Will check CMP, CK.  I am also getting CMP to recheck kidney function, electrolytes.  He does report mildly decreased fluid intake for the past few days and states that his urine has been a bit dark the past 2 days.  Given his concerns for trichomonas we will retest cytology.  Will also get urine dip for further evaluation.  He did express some concerns for potential appendicitis so after discussing with him that he would typically have other symptoms we did decide  to get CBC to evaluate for elevated white count. If his testing is positive for trichomonas I recommend metronidazole 500 mg p.o. twice daily x 7 days rather than single dosing as this does not appear to have been effective previously. Patient reports that he did have a CT scan performed about 2 weeks ago and reported that results were all clear.  I am not able to see record of this in his chart review so I cannot confirm this nor am I sure what kind of CT this was regarding anatomical locations. I reviewed with patient that his symptoms could be related to several conditions: Nephrolithiasis, recurrent trichomonas, recovery from rhabdomyolysis.  Patient seemed less concerned about nephrolithiasis with the recent negative CT scan.  Again I cannot confirm this at this time.  Reviewed with him that he can take Tylenol as needed for pain management and I would not recommend using NSAIDs until kidney function has returned to baseline.  Reviewed hydration recommendations.  Recommend at least 75 ounces of water per day as well as at least 1 electrolyte replacement beverage per day to assist with electrolyte replenishment.  Recommend resting from physical exertion and gentle stretches as needed to prevent stiffness.  Recommend close follow-up with primary care provider for ongoing monitoring and further management as needed.  ED and return precautions were reviewed and provided in after visit summary.  Follow-up as needed    Discharge Instructions      At this time I am concerned that your muscle aches may be secondary to  your recovery from rhabdomyolysis.  Rhabdomyolysis typically causes breakdown of your muscles all over your body and it can take some time to recover from this.  Please make sure that you are drinking plenty of water, at least 75 ounces of water per day.  I recommend implementing at least 1 electrolyte replacement beverage per day such as sugar-free Gatorade, Pedialyte or liquid IV per your  preference.  You can take Tylenol as needed for body aches and you can use Biofreeze or Voltaren gel.  I do not recommend taking NSAIDs until we are sure that your kidney function has returned to normal baseline.  We will retest you for trichomonas.  We will keep you updated with those results.  If you are still positive I recommend a weeklong regimen instead of a single dose to help with definitive resolution.  If your lab work appears like it is returning back to normal I do recommend following up with your primary care provider.  You may need further testing as well as potential referrals to physical therapy for ongoing body aches.     ED Prescriptions   None    PDMP not reviewed this encounter.   Providence Crosby, PA-C 04/22/23 1106    Shaindel Sweeten, Oswaldo Conroy, PA-C 04/22/23 1107

## 2023-04-23 LAB — CBC WITH DIFFERENTIAL/PLATELET
Basophils Absolute: 0.1 10*3/uL (ref 0.0–0.2)
Basos: 2 %
EOS (ABSOLUTE): 0.1 10*3/uL (ref 0.0–0.4)
Eos: 2 %
Hematocrit: 51.4 % — ABNORMAL HIGH (ref 37.5–51.0)
Hemoglobin: 16 g/dL (ref 13.0–17.7)
Immature Grans (Abs): 0 10*3/uL (ref 0.0–0.1)
Immature Granulocytes: 0 %
Lymphocytes Absolute: 2 10*3/uL (ref 0.7–3.1)
Lymphs: 35 %
MCH: 30.1 pg (ref 26.6–33.0)
MCHC: 31.1 g/dL — ABNORMAL LOW (ref 31.5–35.7)
MCV: 97 fL (ref 79–97)
Monocytes Absolute: 0.4 10*3/uL (ref 0.1–0.9)
Monocytes: 6 %
Neutrophils Absolute: 3.1 10*3/uL (ref 1.4–7.0)
Neutrophils: 55 %
Platelets: 390 10*3/uL (ref 150–450)
RBC: 5.31 x10E6/uL (ref 4.14–5.80)
RDW: 11.7 % (ref 11.6–15.4)
WBC: 5.7 10*3/uL (ref 3.4–10.8)

## 2023-04-23 LAB — COMPREHENSIVE METABOLIC PANEL
ALT: 18 IU/L (ref 0–44)
AST: 22 IU/L (ref 0–40)
Albumin: 4.8 g/dL (ref 4.3–5.2)
Alkaline Phosphatase: 62 IU/L (ref 44–121)
BUN/Creatinine Ratio: 11 (ref 9–20)
BUN: 13 mg/dL (ref 6–20)
Bilirubin Total: 0.4 mg/dL (ref 0.0–1.2)
CO2: 27 mmol/L (ref 20–29)
Calcium: 10.2 mg/dL (ref 8.7–10.2)
Chloride: 100 mmol/L (ref 96–106)
Creatinine, Ser: 1.22 mg/dL (ref 0.76–1.27)
Globulin, Total: 2.7 g/dL (ref 1.5–4.5)
Glucose: 81 mg/dL (ref 70–99)
Potassium: 4.9 mmol/L (ref 3.5–5.2)
Sodium: 142 mmol/L (ref 134–144)
Total Protein: 7.5 g/dL (ref 6.0–8.5)
eGFR: 82 mL/min/{1.73_m2} (ref >59)

## 2023-04-23 LAB — CYTOLOGY, (ORAL, ANAL, URETHRAL) ANCILLARY ONLY
Chlamydia: NEGATIVE
Comment: NEGATIVE
Comment: NEGATIVE
Comment: NORMAL
Neisseria Gonorrhea: NEGATIVE
Trichomonas: NEGATIVE

## 2023-04-23 LAB — CK: Total CK: 366 U/L (ref 49–439)

## 2023-04-24 NOTE — Progress Notes (Signed)
 CBC is overall normal and within recommended limits.  No sign of elevated white count indicating infection.  Total CK is within normal limits.  Less concern for ongoing rhabdomyolysis.  Electrolytes, liver and kidney function are overall in normal ranges.  Testing was negative for gonorrhea, chlamydia, trichomonas.  Recommend that he continues to stay well-hydrated as this will allow continued improvement of kidney function and rhabdo from earlier in the month.

## 2023-04-26 DIAGNOSIS — Z8619 Personal history of other infectious and parasitic diseases: Secondary | ICD-10-CM | POA: Diagnosis not present

## 2023-04-26 DIAGNOSIS — Z5329 Procedure and treatment not carried out because of patient's decision for other reasons: Secondary | ICD-10-CM | POA: Diagnosis not present

## 2023-04-26 DIAGNOSIS — R1031 Right lower quadrant pain: Secondary | ICD-10-CM | POA: Diagnosis not present

## 2023-04-26 DIAGNOSIS — Z87891 Personal history of nicotine dependence: Secondary | ICD-10-CM | POA: Diagnosis not present

## 2023-04-26 DIAGNOSIS — I861 Scrotal varices: Secondary | ICD-10-CM | POA: Diagnosis not present

## 2023-04-26 DIAGNOSIS — R109 Unspecified abdominal pain: Secondary | ICD-10-CM | POA: Diagnosis not present

## 2023-04-26 DIAGNOSIS — M6282 Rhabdomyolysis: Secondary | ICD-10-CM | POA: Diagnosis not present

## 2023-04-26 DIAGNOSIS — N433 Hydrocele, unspecified: Secondary | ICD-10-CM | POA: Diagnosis not present

## 2023-04-27 ENCOUNTER — Encounter: Payer: Self-pay | Admitting: Family Medicine

## 2023-04-27 ENCOUNTER — Ambulatory Visit (INDEPENDENT_AMBULATORY_CARE_PROVIDER_SITE_OTHER): Admitting: Family Medicine

## 2023-04-27 VITALS — BP 120/78 | HR 80 | Temp 98.5°F | Ht 74.0 in | Wt 229.0 lb

## 2023-04-27 DIAGNOSIS — R748 Abnormal levels of other serum enzymes: Secondary | ICD-10-CM

## 2023-04-27 DIAGNOSIS — Z1283 Encounter for screening for malignant neoplasm of skin: Secondary | ICD-10-CM

## 2023-04-27 NOTE — Assessment & Plan Note (Addendum)
 History of rhabdomyolysis in the past.  Has been seen at the emergency department several times, recently treated for trichomonas infection. Was seen in the ER yesterday, ultrasound of the testicles done, diagnosed with epididymitis this morning, doxycycline started. Last CK level 434, urinalysis normal (clear, yellow in color), no vomiting, no weakness, no altered mental status.  Symptoms not similar to past rhabdomyolysis symptoms.  CK isoenzymes ordered today.  Creatinine yesterday 1.40, GFR 70, electrolytes within normal range. Endorses body aches on  right side of body. Well-appearing in office today.   Vital signs within limits. History of weight lifting, reports he has not lifted weights in the past 2 weeks. Upon chart review, reported to the emergency department that he has recently started running again. Reports drinking 1 gallon of water per day. Endorses anxiety over health currently. Willing to discuss treating the anxiety at future visit.  Return in one week, keep diary of symptoms. Will discuss further lab testing in one week. Go to ED if symptoms progress,  no red flags today.

## 2023-04-27 NOTE — Progress Notes (Signed)
 Established Patient Office Visit  Subjective   Patient ID: [facility], male    DOB: [birth year]  Age: 28 y.o. MRN: [id number]  Chief Complaint  Patient presents with   Generalized Body Aches   Black specks on finger nails   History of elevated CK    HPI  Went to ED yesterday with abdominal pain. Body aches are concerning for him. U/A yesterday in Ed normal.  No vomiting.  Endorses having anxiety about his symptoms. Diagnosed with trich, treated, retested, positive again. Treated again. Sunday, tested negative for trich after 2nd treatment. Lifts heavy weights, has had elevated in the past. CK level 434. Has had rhabdo in the past. Does not feel the same this time. Specks on nail beds- will send to derm for routine cancer screening.  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  Ultrasound of testicles yesterday, diagnosed with epididymitis.  Prescribed doxycycline today.  Enlarged lymph node on right side.  Endorses adequate water intake. Denies testosterone use. Denies using supplements. Drinks protein shakes.    Chart review:  04/26/23:  CK 434 Lipase 45 CBC: essentially  normal.  04/22/23:  CK 366  Diagnosed with epididymitis in the ED yesterday. Started on doxycycline today.        ROS    Objective:     BP 120/78 (BP Location: Right Arm, Patient Position: Sitting, Cuff Size: Large)   Pulse 80   Temp 98.5 F (36.9 C) (Oral)   Ht 6\' 2"  (1.88 m)   Wt 229 lb (103.9 kg)   SpO2 98%   BMI 29.40 kg/m  BP Readings from Last 3 Encounters:  04/27/23 120/78  04/22/23 (!) 146/83  04/16/23 139/74      Physical Exam Vitals and nursing note reviewed.  Constitutional:      General: He is not in acute distress.    Appearance: Normal appearance. He is normal weight. He is not ill-appearing.  Cardiovascular:     Rate and Rhythm: Normal rate.  Pulmonary:     Effort: Pulmonary effort is normal.  Skin:    General: Skin is warm and dry.   Neurological:     General: No focal deficit present.     Mental Status: He is alert and oriented to person, place, and time. Mental status is at baseline.  Psychiatric:        Mood and Affect: Mood normal.        Behavior: Behavior normal.        Thought Content: Thought content normal.        Judgment: Judgment normal.     No results found for any visits on 04/27/23.  Last metabolic panel Lab Results  Component Value Date   GLUCOSE 81 04/22/2023   NA 142 04/22/2023   K 4.9 04/22/2023   CL 100 04/22/2023   CO2 27 04/22/2023   BUN 13 04/22/2023   CREATININE 1.22 04/22/2023   EGFR 82 04/22/2023   CALCIUM 10.2 04/22/2023   PHOS 4.4 07/07/2016   PROT 7.5 04/22/2023   ALBUMIN 4.8 04/22/2023   LABGLOB 2.7 04/22/2023   BILITOT 0.4 04/22/2023   ALKPHOS 62 04/22/2023   AST 22 04/22/2023   ALT 18 04/22/2023   ANIONGAP 6 01/31/2018      The ASCVD Risk score (Arnett DK, et al., 2019) failed to calculate for the following reasons:   The 2019 ASCVD risk score is only valid for ages 65 to 39    Assessment & Plan:   Problem  List Items Addressed This Visit     Screening for skin cancer   Relevant Orders   Ambulatory referral to Dermatology   Elevated CK - Primary   History of rhabdomyolysis in the past.  Has been seen at the emergency department several times, recently treated for trichomonas infection. Was seen in the ER yesterday, ultrasound of the testicles done, diagnosed with epididymitis this morning, doxycycline started. Last CK level 434, urinalysis normal (clear, yellow in color), no vomiting, no weakness, no altered mental status.  Symptoms not similar to past rhabdomyolysis symptoms.  CK isoenzymes ordered today.  Creatinine yesterday 1.40, GFR 70, electrolytes within normal range. Endorses body aches on  right side of body. Well-appearing in office today.   Vital signs within limits. History of weight lifting, reports he has not lifted weights in the past 2  weeks. Upon chart review, reported to the emergency department that he has recently started running again. Reports drinking 1 gallon of water per day. Endorses anxiety over health currently. Willing to discuss treating the anxiety at future visit.  Return in one week, keep diary of symptoms. Will discuss further lab testing in one week. Go to ED if symptoms progress,  no red flags today.        Relevant Orders   CK isoenzymes (brain, muscle injury)  Agrees with plan of care discussed.  Questions answered.  Total face to face time discussing symptoms, recent ED visit, history: 45 minutes   Return in about 1 week (around 05/04/2023) for joint pain .    Novella Olive, FNP

## 2023-05-01 ENCOUNTER — Encounter: Payer: Self-pay | Admitting: Family Medicine

## 2023-05-01 LAB — CK ISOENZYMES
CK-BB: 0 %
CK-MB: 0 % (ref 0–3)
CK-MM: 100 % (ref 97–100)
Macro Type 1: 0 %
Macro Type 2: 0 %
Total CK: 350 U/L (ref 49–439)

## 2023-05-02 ENCOUNTER — Other Ambulatory Visit: Payer: Self-pay

## 2023-05-02 ENCOUNTER — Ambulatory Visit
Admission: RE | Admit: 2023-05-02 | Discharge: 2023-05-02 | Disposition: A | Discharge status: Home / Self Care | Source: Ambulatory Visit | Attending: Family Medicine | Admitting: Family Medicine

## 2023-05-02 ENCOUNTER — Ambulatory Visit (HOSPITAL_COMMUNITY): Payer: Self-pay

## 2023-05-02 VITALS — BP 125/82 | HR 80 | Temp 98.3°F | Resp 16

## 2023-05-02 DIAGNOSIS — N451 Epididymitis: Secondary | ICD-10-CM | POA: Diagnosis not present

## 2023-05-02 NOTE — Discharge Instructions (Signed)
Your exam today is normal

## 2023-05-02 NOTE — ED Triage Notes (Signed)
 Patient states he has some testicular swelling and is curently being treated for epididimitis. Patient discharge or dysuria.

## 2023-05-02 NOTE — ED Provider Notes (Signed)
 [facility]    CSN: [id number] Arrival date & time: 05/02/23  0906      History   Chief Complaint Chief Complaint  Patient presents with   SEXUALLY TRANSMITTED DISEASE    Currently completing medication for Epididymitis. After recently finishing meds for trich and testing negative. Had sex yesterday and used a condom. Condom was very dry putting it on . And now testicles look and feel swollen and penis feels hot - Entered by patient   Groin Swelling    HPI [facility] is a 28 y.o. male who presents with hot sensation on his penile shaft and swollen testicles. Had sex around 9 am yesterday, then  about 11 HOURS  later noted like his testicles are swollen and penis is feeling hot. He is currently being treated for epididymitis for the past 3 days. Was in ER 4 days ago and had normal testicular ultrasound.     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 Authorizing Provider  doxycycline (VIBRAMYCIN) 100 MG capsule Take 100 mg by mouth 2 (two) times daily. 04/26/23 05/06/23  [provider]  FIBERCON 625 MG tablet Take by mouth. 04/12/23 04/11/24  [provider]  lansoprazole (PREVACID) 30 MG capsule Take 1 capsule (30 mg total) by mouth in the morning. 30 minutes prior to  breakfast meal 04/05/23 07/04/23  Hermanns, Ashlee P, PA-C  metroNIDAZOLE (FLAGYL) 500 MG tablet Take by mouth 2 (two) times daily. 04/17/23   [provider]    Family History Family History  Problem Relation Age of Onset   Diabetes Mother    Healthy Father    Healthy Brother     Social History Social History   Tobacco Use   Smoking status: Some Days    Current packs/day: 0.25    Types: Cigarettes, Cigars    Passive exposure: Past   Smokeless tobacco: Never  Vaping Use   Vaping status: Some Days  Substance Use Topics   Alcohol use: Yes    Comment: occasionally   Drug use: Not Currently    Types: Marijuana     Allergies   Patient has no known allergies.   Review of Systems Review of Systems  As noted in HPI Physical Exam Triage Vital Signs ED Triage Vitals  Encounter Vitals Group     BP 05/02/23 0919 125/82     Systolic BP Percentile --      Diastolic BP Percentile --      Pulse Rate 05/02/23 0919 80     Resp 05/02/23 0919 16     Temp 05/02/23 0919 98.3 F (36.8 C)     Temp Source 05/02/23 0919 Oral     SpO2 05/02/23 0919 98 %  Weight --      Height --      Head Circumference --      Peak Flow --      Pain Score 05/02/23 0920 6     Pain Loc --      Pain Education --      Exclude from Growth Chart --    No data found.  Updated Vital Signs BP 125/82 (BP Location: Right Arm)   Pulse 80   Temp 98.3 F (36.8 C) (Oral)   Resp 16   SpO2 98%   Visual Acuity Right Eye Distance:   Left Eye Distance:   Bilateral Distance:    Right Eye Near:   Left Eye Near:    Bilateral Near:     Physical Exam Vitals and nursing note reviewed. Exam conducted with a chaperone present.  Constitutional:      General: He is not in acute distress.    Appearance: He is not toxic-appearing.  Eyes:     General: No scleral icterus.    Conjunctiva/sclera: Conjunctivae normal.  Pulmonary:     Effort: Pulmonary effort is normal.  Genitourinary:    Penis:  Normal.      Testes: Normal.  Musculoskeletal:        General: Normal range of motion.     Cervical back: Neck supple.  Skin:    General: Skin is warm and dry.  Neurological:     Mental Status: He is alert and oriented to person, place, and time.     Gait: Gait normal.  Psychiatric:        Mood and Affect: Mood normal.        Behavior: Behavior normal.        Thought Content: Thought content normal.        Judgment: Judgment normal.      UC Treatments / Results  Labs (all labs ordered are listed, but only abnormal results are displayed) Labs Reviewed - No data to display  EKG   Radiology No results found.  Procedures Procedures (including critical care time)  Medications Ordered in UC Medications - No data to display  Initial Impression / Assessment and Plan / UC Course  I have reviewed the triage vital signs and the nursing notes.  Normal genital exam with burning sensation of penile shaft  I reassured him, his exam is normal. He is to finish his Doxy for current treatment.  Final Clinical Impressions(s) / UC Diagnoses   Final diagnoses:  Epididymitis     Discharge Instructions      Your exam today is normal      ED Prescriptions   None    PDMP not reviewed this encounter.   Garey Ham, PA-C 05/02/23 1003

## 2023-05-04 ENCOUNTER — Ambulatory Visit: Admitting: Family Medicine

## 2023-05-04 ENCOUNTER — Encounter: Payer: Self-pay | Admitting: Family Medicine

## 2023-05-04 VITALS — BP 131/86 | HR 77 | Temp 98.6°F | Ht 74.0 in | Wt 236.0 lb

## 2023-05-04 DIAGNOSIS — R4589 Other symptoms and signs involving emotional state: Secondary | ICD-10-CM | POA: Diagnosis not present

## 2023-05-04 DIAGNOSIS — M2550 Pain in unspecified joint: Secondary | ICD-10-CM | POA: Insufficient documentation

## 2023-05-04 NOTE — Assessment & Plan Note (Signed)
 Symptoms have improved since last visit. Reports that after speaking with provider he feels better. Will continue to monitor for changes.

## 2023-05-04 NOTE — Progress Notes (Signed)
 Established Patient Office Visit  Subjective   Patient ID: Caleb Morrison, male    DOB: [birth year]  Age: 28 y.o. MRN: [id number]  Chief Complaint  Patient presents with   Joint pain    1 week follow up    HPI  Knee pain: symptoms have gotten better in right knee.  Muscle aches: symptoms have have gotten better. Feels like symptoms have improved since last visit with PCP after our conversation.  Discussed anxiety about health. Has been worried about having HIV and trich infection. Declines therapy or medication for anxiety.    Chart review: 04/26/23: creatine 1.40       Review of Systems  Constitutional:  Negative for chills and fever.  Respiratory:  Negative for shortness of breath.   Cardiovascular:  Negative for chest pain.  Gastrointestinal:  Negative for abdominal pain, diarrhea, nausea and vomiting.  Psychiatric/Behavioral:  The patient is nervous/anxious.       Objective:     BP 131/86 (BP Location: Left Arm, Patient Position: Sitting, Cuff Size: Large)   Pulse 77   Temp 98.6 F (37 C) (Oral)   Ht 6\' 2"  (1.88 m)   Wt 236 lb (107 kg)   SpO2 98%   BMI 30.30 kg/m  BP Readings from Last 3 Encounters:  05/04/23 131/86  05/02/23 125/82  04/27/23 120/78      Physical Exam Vitals and nursing note reviewed.  Constitutional:      General: He is not in acute distress.    Appearance: Normal appearance. He is normal weight.  Cardiovascular:     Rate and Rhythm: Normal rate.  Pulmonary:     Effort: Pulmonary effort is normal.  Skin:    General: Skin is warm and dry.  Neurological:     General: No focal deficit present.     Mental Status: He is alert. Mental status is at baseline.  Psychiatric:        Mood and Affect: Mood normal.        Behavior: Behavior normal.        Thought Content: Thought content normal.        Judgment: Judgment normal.    Flowsheet Row Office Visit from 05/04/2023 in [facility]  PHQ-9 Total  Score 4         05/04/2023    8:45 AM  GAD 7 : Generalized Anxiety Score  Nervous, Anxious, on Edge 1  Control/stop worrying 2  Worry too much - different things 2  Trouble relaxing 2  Restless 1  Easily annoyed or irritable 2  Afraid - awful might happen 2  Total GAD 7 Score 12  Anxiety Difficulty Somewhat difficult      No results found for any visits on 05/04/23.  Last CBC Lab Results  Component Value Date   WBC 5.7 04/22/2023   HGB 16.0 04/22/2023   HCT 51.4 (H) 04/22/2023   MCV 97 04/22/2023   MCH 30.1 04/22/2023   RDW 11.7 04/22/2023   PLT 390 04/22/2023   Last metabolic panel Lab Results  Component Value Date   GLUCOSE 81 04/22/2023   NA 142 04/22/2023   K 4.9 04/22/2023   CL 100 04/22/2023   CO2 27 04/22/2023   BUN 13 04/22/2023   CREATININE 1.22 04/22/2023   EGFR 82 04/22/2023   CALCIUM 10.2 04/22/2023   PHOS 4.4 07/07/2016   PROT 7.5 04/22/2023   ALBUMIN 4.8 04/22/2023   LABGLOB 2.7 04/22/2023  BILITOT 0.4 04/22/2023   ALKPHOS 62 04/22/2023   AST 22 04/22/2023   ALT 18 04/22/2023   ANIONGAP 6 01/31/2018      The ASCVD Risk score (Arnett DK, et al., 2019) failed to calculate for the following reasons:   The 2019 ASCVD risk score is only valid for ages 75 to 77    Assessment & Plan:   Problem List Items Addressed This Visit     Arthralgia   Symptoms have improved since last visit. Reports that after speaking with provider he feels better. Will continue to monitor for changes.       Anxiety about health - Primary   Discussed GAD-7 score.  Does not feel he needs medication or counseling. Feels it is normal to worry about health. Feels much better. Will continue to monitor for changes.       Agrees with plan of care discussed.  Questions answered.  Total time face to face discussing symptoms and anxiety: 20 minutes   Return for 4/2 for CPE as scheduled. Novella Olive, FNP

## 2023-05-04 NOTE — Assessment & Plan Note (Signed)
 Discussed GAD-7 score.  Does not feel he needs medication or counseling. Feels it is normal to worry about health. Feels much better. Will continue to monitor for changes.

## 2023-05-08 ENCOUNTER — Ambulatory Visit (HOSPITAL_COMMUNITY): Payer: Self-pay

## 2023-05-09 ENCOUNTER — Encounter: Payer: Self-pay | Admitting: Family Medicine

## 2023-05-09 ENCOUNTER — Ambulatory Visit (INDEPENDENT_AMBULATORY_CARE_PROVIDER_SITE_OTHER): Admitting: Family Medicine

## 2023-05-09 VITALS — BP 136/86 | HR 80 | Temp 98.6°F | Resp 18 | Ht 74.0 in | Wt 234.7 lb

## 2023-05-09 DIAGNOSIS — Z13220 Encounter for screening for lipoid disorders: Secondary | ICD-10-CM

## 2023-05-09 DIAGNOSIS — Z0000 Encounter for general adult medical examination without abnormal findings: Secondary | ICD-10-CM | POA: Diagnosis not present

## 2023-05-09 DIAGNOSIS — Z1329 Encounter for screening for other suspected endocrine disorder: Secondary | ICD-10-CM

## 2023-05-09 DIAGNOSIS — Z13228 Encounter for screening for other metabolic disorders: Secondary | ICD-10-CM | POA: Diagnosis not present

## 2023-05-09 DIAGNOSIS — Z136 Encounter for screening for cardiovascular disorders: Secondary | ICD-10-CM | POA: Diagnosis not present

## 2023-05-09 NOTE — Progress Notes (Signed)
 Complete physical exam  Patient: Caleb Morrison   DOB: 01-03-1996   28 y.o. Male  MRN: [id number]  Subjective:    Chief Complaint  Patient presents with   Annual Exam    Patient  is here for his annual physical , patient is not fasting     Caleb Morrison is a 28 y.o. male who presents today for a complete physical exam. He reports consuming a  high protein and salads  diet. Gym/ health club routine includes cardio and mod to heavy weightlifting. He generally feels well. He reports sleeping well. He does not have additional problems to discuss today.    Most recent fall risk assessment:    05/04/2023    8:45 AM  Fall Risk   Falls in the past year? 0  Number falls in past yr: 0  Injury with Fall? 0  Risk for fall due to : No Fall Risks  Follow up Falls evaluation completed     Most recent depression screenings:    05/04/2023    8:41 AM  PHQ 2/9 Scores  PHQ - 2 Score 2  PHQ- 9 Score 4    Vision:Within last year and Dental: No current dental problems and Receives regular dental care    Patient Care Team: Novella Olive, FNP as PCP - General (Family Medicine)   Outpatient Medications Prior to Visit  Medication Sig   lansoprazole (PREVACID) 30 MG capsule Take 1 capsule (30 mg total) by mouth in the morning. 30 minutes prior to breakfast meal   [DISCONTINUED] FIBERCON 625 MG tablet Take by mouth.   [DISCONTINUED] metroNIDAZOLE (FLAGYL) 500 MG tablet Take by mouth 2 (two) times daily.   No facility-administered medications prior to visit.    ROS        Objective:     BP 136/86   Pulse 80   Temp 98.6 F (37 C) (Oral)   Resp 18   Ht 6\' 2"  (1.88 m)   Wt 234 lb 11.2 oz (106.5 kg)   SpO2 99%   BMI 30.13 kg/m  BP Readings from Last 3 Encounters:  05/09/23 136/86  05/04/23 131/86  05/02/23 125/82      Physical Exam Vitals and nursing note reviewed.  Constitutional:      General: He is not in acute distress.    Appearance: Normal appearance.  HENT:      Right Ear: Tympanic membrane normal.     Left Ear: Tympanic membrane normal.     Nose: Nose normal.     Mouth/Throat:     Mouth: Mucous membranes are moist.     Pharynx: Oropharynx is clear.  Eyes:     Extraocular Movements: Extraocular movements intact.  Neck:     Thyroid: No thyroid tenderness.  Cardiovascular:     Rate and Rhythm: Normal rate and regular rhythm.     Pulses:          Radial pulses are 2+ on the right side and 2+ on the left side.     Heart sounds: Normal heart sounds, S1 normal and S2 normal.  Pulmonary:     Effort: Pulmonary effort is normal.     Breath sounds: Normal breath sounds.  Abdominal:     General: Bowel sounds are normal.     Palpations: Abdomen is soft.     Tenderness: There is no abdominal tenderness.  Musculoskeletal:        General: Normal range of motion.     Cervical back:  Normal range of motion.     Right lower leg: No edema.     Left lower leg: No edema.  Lymphadenopathy:     Cervical:     Right cervical: No superficial cervical adenopathy.    Left cervical: No superficial cervical adenopathy.  Skin:    General: Skin is warm and dry.  Neurological:     General: No focal deficit present.     Mental Status: He is alert. Mental status is at baseline.  Psychiatric:        Mood and Affect: Mood normal.        Behavior: Behavior normal.        Thought Content: Thought content normal.        Judgment: Judgment normal.     No results found for any visits on 05/09/23. Last CBC Lab Results  Component Value Date   WBC 5.7 04/22/2023   HGB 16.0 04/22/2023   HCT 51.4 (H) 04/22/2023   MCV 97 04/22/2023   MCH 30.1 04/22/2023   RDW 11.7 04/22/2023   PLT 390 04/22/2023   Last metabolic panel Lab Results  Component Value Date   GLUCOSE 81 04/22/2023   NA 142 04/22/2023   K 4.9 04/22/2023   CL 100 04/22/2023   CO2 27 04/22/2023   BUN 13 04/22/2023   CREATININE 1.22 04/22/2023   EGFR 82 04/22/2023   CALCIUM 10.2 04/22/2023   PHOS  4.4 07/07/2016   PROT 7.5 04/22/2023   ALBUMIN 4.8 04/22/2023   LABGLOB 2.7 04/22/2023   BILITOT 0.4 04/22/2023   ALKPHOS 62 04/22/2023   AST 22 04/22/2023   ALT 18 04/22/2023   ANIONGAP 6 01/31/2018        Assessment & Plan:    Routine Health Maintenance and Physical Exam  Immunization History  Administered Date(s) Administered   Tdap 06/09/2017    Health Maintenance  Topic Date Due   Pneumococcal Vaccine 39-63 Years old (1 of 2 - PCV) Never done   COVID-19 Vaccine (1 - 2024-25 season) Never done   INFLUENZA VACCINE  09/07/2023   DTaP/Tdap/Td (2 - Td or Tdap) 06/10/2027   Hepatitis C Screening  Completed   HIV Screening  Completed   HPV VACCINES  Aged Out    Discussed health benefits of physical activity, and encouraged him to engage in regular exercise appropriate for his age and condition.  Problem List Items Addressed This Visit     Screening for thyroid disorder - Primary   Relevant Orders   TSH + free T4    Routine labs ordered.  HCM reviewed/discussed. Declines vaccines today. Anticipatory guidance regarding healthy weight, lifestyle and choices given. Recommend healthy diet.  Recommend approximately 150 minutes/week of moderate intensity exercise. Resistance training is good for building muscles and for bone health. Muscle mass helps to increase our metabolism and to burn more calories at rest.  Limit alcohol consumption: no more than one drink per day for women and 2 drinks per day for me. Recommend regular dental and vision exams. Always use seatbelt/lap and shoulder restraints. Recommend using smoke alarms and checking batteries at least twice a year. Recommend using sunscreen when outside.  Please know that I am here to help you with all of your health care goals and am happy to work with you to find a solution that works best for you.  The greatest advice I have received with any changes in life are to take it one step at a time, that even means  if all  you can focus on is the next 60 seconds, then do that and celebrate your victories.  With any changes in life, you will have set backs, and that is OK. The important thing to remember is, if you have a set back, it is not a failure, it is an opportunity to try again! Agrees with plan of care discussed.  Questions answered.      Return in about 1 year (around 05/08/2024) for CPE with labs.     Novella Olive, FNP

## 2023-05-10 ENCOUNTER — Ambulatory Visit
Admission: RE | Admit: 2023-05-10 | Discharge: 2023-05-10 | Disposition: A | Discharge status: Home / Self Care | Source: Ambulatory Visit | Attending: Emergency Medicine | Admitting: Emergency Medicine

## 2023-05-10 ENCOUNTER — Encounter: Payer: Self-pay | Admitting: Family Medicine

## 2023-05-10 VITALS — BP 147/95 | HR 83 | Temp 98.0°F | Resp 18

## 2023-05-10 DIAGNOSIS — R4589 Other symptoms and signs involving emotional state: Secondary | ICD-10-CM | POA: Insufficient documentation

## 2023-05-10 DIAGNOSIS — Z113 Encounter for screening for infections with a predominantly sexual mode of transmission: Secondary | ICD-10-CM | POA: Diagnosis not present

## 2023-05-10 HISTORY — DX: Epididymitis: N45.1

## 2023-05-10 HISTORY — DX: Acute kidney failure, unspecified: N17.9

## 2023-05-10 HISTORY — DX: Other symptoms and signs involving emotional state: R45.89

## 2023-05-10 HISTORY — DX: Gastro-esophageal reflux disease without esophagitis: K21.9

## 2023-05-10 HISTORY — DX: Chronic kidney disease, unspecified: N18.9

## 2023-05-10 LAB — HEMOGLOBIN A1C
Est. average glucose Bld gHb Est-mCnc: 94 mg/dL
Hgb A1c MFr Bld: 4.9 % (ref 4.8–5.6)

## 2023-05-10 LAB — LIPID PANEL
Chol/HDL Ratio: 2.6 ratio (ref 0.0–5.0)
Cholesterol, Total: 136 mg/dL (ref 100–199)
HDL: 53 mg/dL (ref >39)
LDL Chol Calc (NIH): 61 mg/dL (ref 0–99)
Triglycerides: 124 mg/dL (ref 0–149)
VLDL Cholesterol Cal: 22 mg/dL (ref 5–40)

## 2023-05-10 LAB — TSH+FREE T4
Free T4: 0.94 ng/dL (ref 0.82–1.77)
TSH: 0.63 u[IU]/mL (ref 0.450–4.500)

## 2023-05-10 NOTE — ED Triage Notes (Signed)
 Pt states he wants to get tested for Trich and HIV. The last time he had sex was 2/26. Pt denies any STD symptoms.   Pt states he had body aches for the past month and a half.  Pt states he has not been working out as much.   Pt states his skin skin will feel hot sometimes and his left eye started twitching yesterday.

## 2023-05-10 NOTE — ED Provider Notes (Signed)
 [name]    CSN: [id number] Arrival date & time: 05/10/23  1902    HISTORY   Chief Complaint  Patient presents with   STD Testing   HPI Caleb Morrison is a pleasant, 28 y.o. male who presents to urgent care today. Pt states he wants to get tested for Trich and HIV. The last time he had sex was 2/26. Pt denies any STD symptoms.  Patient states that the day after having sexual intercourse, he tested positive for trichomonas.  Patient then returned for repeat testing after treatment on March 10 and was still positive.  Patient returned 6 days later, was again tested and was negative for trichomonas.  Patient states he would like to be tested 1 more time just to be sure that the infection has completely resolved.  Patient is also requesting repeat HIV testing, last HIV testing was March 10 and was negative.  Patient admits to having anxiety about his health and states that he would like to have this testing done today to alleviate his anxiety.  The history is provided by the patient.      Past Medical History:  Diagnosis Date   Acute renal failure (ARF) (HCC)    Anxiety about health    CKD (chronic kidney disease)    Epididymitis    Gastro-esophageal reflux    Rhabdomyolysis    Trichomoniasis    Patient Active Problem List   Diagnosis Date Noted   Arthralgia 05/04/2023   Anxiety about health 05/04/2023   Elevated CK 04/27/2023   Gastroesophageal reflux disease 04/11/2023   History of trichomoniasis 04/11/2023   Screening for thyroid disorder 12/11/2022   Acute kidney injury (HCC) 07/08/2016   Hyperglycemia 07/07/2016   Acute renal failure (ARF) (HCC) 07/06/2016   Rhabdomyolysis 06/29/2016   CKD (chronic kidney disease), 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 Authorizing Provider  lansoprazole (PREVACID) 30 MG capsule Take 1 capsule (30 mg total) by mouth in the morning. 30 minutes prior to breakfast meal 04/05/23 07/04/23 Yes Hermanns, Ashlee P, PA-C    Family History Family History  Problem Relation Age of Onset   Diabetes Mother    Healthy Father    Healthy Brother    Social History Social History   Tobacco Use   Smoking status: Some Days    Current packs/day: 0.25    Types: Cigarettes, Cigars    Passive exposure: Past   Smokeless tobacco: Never  Vaping Use   Vaping status: Former  Substance Use Topics   Alcohol use: Yes    Comment: occasionally   Drug use: Not Currently    Types: Marijuana   Allergies   Patient has no known allergies.  Review of Systems Review of Systems Pertinent findings revealed after performing a 14 point review of systems has been noted in the history of present illness.  Physical Exam Vital Signs BP (!) 147/95 (BP Location: Right Arm)   Pulse 83   Temp 98 F (36.7 C) (Oral)   Resp 18   SpO2 94%   No data found.  Physical Exam Vitals and nursing note reviewed.  Constitutional:      General: He is not in acute distress.    Appearance:  Normal appearance. He is not ill-appearing.  HENT:     Head: Normocephalic and atraumatic.  Eyes:     General: Lids are normal.        Right eye: No discharge.        Left eye: No discharge.     Extraocular Movements: Extraocular movements intact.     Conjunctiva/sclera: Conjunctivae normal.     Right eye: Right conjunctiva is not injected.     Left eye: Left conjunctiva is not injected.  Neck:     Trachea: Trachea and phonation normal.  Cardiovascular:     Rate and Rhythm: Normal rate and regular rhythm.     Pulses: Normal pulses.     Heart sounds: Normal heart sounds. No murmur heard.    No friction rub. No gallop.  Pulmonary:     Effort: Pulmonary effort is normal. No accessory muscle usage, prolonged expiration or  respiratory distress.     Breath sounds: Normal breath sounds. No stridor, decreased air movement or transmitted upper airway sounds. No decreased breath sounds, wheezing, rhonchi or rales.  Chest:     Chest wall: No tenderness.  Genitourinary:    Comments: Pt politely declines GU exam, pt did provide a penile swab for testing.   Musculoskeletal:        General: Normal range of motion.     Cervical back: Normal range of motion and neck supple. Normal range of motion.  Lymphadenopathy:     Cervical: No cervical adenopathy.  Skin:    General: Skin is warm and dry.     Findings: No erythema or rash.  Neurological:     General: No focal deficit present.     Mental Status: He is alert and oriented to person, place, and time.  Psychiatric:        Mood and Affect: Mood normal.        Behavior: Behavior normal.     Visual Acuity Right Eye Distance:   Left Eye Distance:   Bilateral Distance:    Right Eye Near:   Left Eye Near:    Bilateral Near:     UC Couse / Diagnostics / Procedures:     Radiology No results found.  Procedures Procedures (including critical care time) EKG  Pending results:  Labs Reviewed  RPR  HIV ANTIBODY (ROUTINE TESTING W REFLEX)  CYTOLOGY, (ORAL, ANAL, URETHRAL) ANCILLARY ONLY    Medications Ordered in UC: Medications - No data to display  UC Diagnoses / Final Clinical Impressions(s)   I have reviewed the triage vital signs and the nursing notes.  Pertinent labs & imaging results that were available during my care of the patient were reviewed by me and considered in my medical decision making (see chart for details).    Final diagnoses:  Screening examination for STD (sexually transmitted disease)  Anxiety about health   STD screening was performed, patient advised that the results be posted to their MyChart and if any of the results are positive, they will be notified by phone, further treatment will be provided as indicated based on  results of STD screening. Patient was advised to abstain from sexual intercourse until that they receive the results of their STD testing.  Patient was also advised to use condoms to protect themselves from STD exposure. Return precautions advised.  Drug allergies reviewed, all questions addressed.   Please see discharge instructions below for details of plan of care as provided to patient. ED Prescriptions   None  PDMP not reviewed this encounter.  Disposition Upon Discharge:  Condition: stable for discharge home  Patient presented with concern for an acute illness with associated systemic symptoms and significant discomfort requiring urgent management. In my opinion, this is a condition that a prudent lay person (someone who possesses an average knowledge of health and medicine) may potentially expect to result in complications if not addressed urgently such as respiratory distress, impairment of bodily function or dysfunction of bodily organs.   As such, the patient has been evaluated and assessed, work-up was performed and treatment was provided in alignment with urgent care protocols and evidence based medicine.  Patient/parent/caregiver has been advised that the patient may require follow up for further testing and/or treatment if the symptoms continue in spite of treatment, as clinically indicated and appropriate.  Routine symptom specific, illness specific and/or disease specific instructions were discussed with the patient and/or caregiver at length.  Prevention strategies for avoiding STD exposure were also discussed.  The patient will follow up with their current PCP if and as advised. If the patient does not currently have a PCP we will assist them in obtaining one.   The patient may need specialty follow up if the symptoms continue, in spite of conservative treatment and management, for further workup, evaluation, consultation and treatment as clinically indicated and  appropriate.  Patient/parent/caregiver verbalized understanding and agreement of plan as discussed.  All questions were addressed during visit.  Please see discharge instructions below for further details of plan.    Discharge Instructions      The results of your STD testing today which screens for gonorrhea, chlamydia, and trichomonas will be posted to your MyChart account once it is complete.  This typically takes 2 to 4 days.  Please abstain from sexual intercourse of any kind, vaginal, oral or anal, until you have received the results of your STD testing.      The results of your HIV and syphilis blood tests will be made available to you once they are complete.  They will initially be posted to your MyChart account which typically takes 2 to 3 days.      If any of your results are abnormal, you will receive a phone call regarding treatment.  Prescriptions, if any are needed, will be provided for you at your pharmacy.      Thank you for visiting [facility] today.  We appreciate the opportunity to participate in your care.     This office note has been dictated using Teaching laboratory technician.  Unfortunately, this method of dictation can sometimes lead to typographical or grammatical errors.  I apologize for your inconvenience in advance if this occurs.  Please do not hesitate to reach out to me if clarification is needed.       Theadora Rama Scales, [place] 05/10/23 1956

## 2023-05-10 NOTE — Discharge Instructions (Signed)
 The results of your STD testing today which screens for gonorrhea, chlamydia, and trichomonas will be posted to your MyChart account once it is complete.  This typically takes 2 to 4 days.  Please abstain from sexual intercourse of any kind, vaginal, oral or anal, until you have received the results of your STD testing.      The results of your HIV and syphilis blood tests will be made available to you once they are complete.  They will initially be posted to your MyChart account which typically takes 2 to 3 days.      If any of your results are abnormal, you will receive a phone call regarding treatment.  Prescriptions, if any are needed, will be provided for you at your pharmacy.      Thank you for visiting [facility] today.  We appreciate the opportunity to participate in your care.

## 2023-05-11 LAB — CYTOLOGY, (ORAL, ANAL, URETHRAL) ANCILLARY ONLY
Chlamydia: NEGATIVE
Comment: NEGATIVE
Comment: NEGATIVE
Comment: NORMAL
Neisseria Gonorrhea: NEGATIVE
Trichomonas: NEGATIVE

## 2023-05-11 LAB — RPR: RPR Ser Ql: NONREACTIVE

## 2023-05-11 LAB — HIV ANTIBODY (ROUTINE TESTING W REFLEX): HIV Screen 4th Generation wRfx: NONREACTIVE

## 2023-05-14 ENCOUNTER — Encounter: Payer: Self-pay | Admitting: Emergency Medicine

## 2023-05-16 ENCOUNTER — Encounter (HOSPITAL_COMMUNITY): Payer: Self-pay

## 2023-05-16 ENCOUNTER — Ambulatory Visit (HOSPITAL_COMMUNITY)
Admission: RE | Admit: 2023-05-16 | Discharge: 2023-05-16 | Disposition: A | Discharge status: Home / Self Care | Payer: Self-pay | Source: Ambulatory Visit | Attending: Emergency Medicine | Admitting: Emergency Medicine

## 2023-05-16 VITALS — BP 138/94 | HR 69 | Temp 98.4°F | Resp 16

## 2023-05-16 DIAGNOSIS — Z113 Encounter for screening for infections with a predominantly sexual mode of transmission: Secondary | ICD-10-CM | POA: Insufficient documentation

## 2023-05-16 NOTE — Discharge Instructions (Signed)
 Today you have been screened for gonorrhea, chlamydia and trichomoniasis.  Results will be back in MyChart over the next day or so and our staff will contact you if results require treatment.  Abstain from intercourse until all results have been received.  Notify any sexual partners of any positive results.  Return to clinic if you develop any new, concerning symptoms such as testicular swelling, fevers, nausea, or vomiting.

## 2023-05-16 NOTE — ED Provider Notes (Signed)
 [facility]    CSN: [id number] Arrival date & time: 05/16/23  0930      History   Chief Complaint Chief Complaint  Patient presents with   Exposure to STD    I want to be tested for all std . I came in contact with some one with trich. And I want to be treated for trich as well. - Entered by patient    HPI Caleb Morrison is a 27 y.o. male.   Patient presents to clinic over concern of potential sexually transmitted infection.  Approximately 3 days ago he had intercourse, initially was wearing a condom, and then he took the condom off and had unprotected sex.  Does have a history of trichomoniasis, most recently in March.  History of epididymitis as well.  Reports that the sensation in his testicles does not feel like it did when he had epididymitis.  He has been having some dysuria and bladder pressure.  Symptoms occurred approximately 6 hours after unprotected intercourse.  Reports the sexual partner has no history of STIs. Denies penile discharge.   Patient had recent normal HIV and syphilis screening, declined today in clinic. Admits to having anxiety about his health.   The history is provided by the patient and medical records.  Exposure to STD    Past Medical History:  Diagnosis Date   Acute renal failure (ARF) (HCC)    Anxiety about health    CKD (chronic kidney disease)    Epididymitis    Gastro-esophageal reflux    Rhabdomyolysis    Trichomoniasis     Patient Active Problem List   Diagnosis Date Noted   Arthralgia 05/04/2023   Anxiety about health 05/04/2023   Elevated CK 04/27/2023   Gastroesophageal reflux disease 04/11/2023   History of trichomoniasis 04/11/2023   Screening for thyroid disorder 12/11/2022   Acute kidney injury (HCC) 07/08/2016   Hyperglycemia 07/07/2016   Acute renal failure (ARF) (HCC) 07/06/2016   Rhabdomyolysis 06/29/2016   CKD (chronic kidney disease), 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   Leukocytosis 06/28/2016   Metabolic acidosis  06/28/2016    Past Surgical History:  Procedure Laterality Date   ELBOW SURGERY     UPPER GI ENDOSCOPY         Home Medications    Prior to Admission medications   Not on File    Family History Family History  Problem Relation Age of Onset   Diabetes Mother    Healthy Father    Healthy Brother     Social History Social History   Tobacco Use   Smoking status: Some Days    Current packs/day: 0.25    Types: Cigarettes, Cigars    Passive exposure: Past   Smokeless tobacco: Never  Vaping Use   Vaping status: Former  Substance Use Topics   Alcohol use: Yes    Comment: occasionally   Drug use: Not Currently    Types: Marijuana     Allergies   Patient has no known allergies.   Review of Systems Review of Systems  Per HPI  Physical Exam Triage Vital Signs ED Triage Vitals  Encounter Vitals Group     BP 05/16/23 0948 (!) 138/94     Systolic BP Percentile --      Diastolic BP Percentile --      Pulse Rate 05/16/23 0948 69     Resp 05/16/23 0948 16     Temp 05/16/23 0948 98.4 F (36.9 C)     Temp Source 05/16/23  0948 Oral     SpO2 05/16/23 0948 96 %     Weight --      Height --      Head Circumference --      Peak Flow --      Pain Score 05/16/23 0949 0     Pain Loc --      Pain Education --      Exclude from Growth Chart --    No data found.  Updated Vital Signs BP (!) 138/94 (BP Location: Left Arm)   Pulse 69   Temp 98.4 F (36.9 C) (Oral)   Resp 16   SpO2 96%   Visual Acuity Right Eye Distance:   Left Eye Distance:   Bilateral Distance:    Right Eye Near:   Left Eye Near:    Bilateral Near:     Physical Exam Vitals and nursing note reviewed.  Constitutional:      Appearance: Normal appearance.  HENT:     Head: Normocephalic and atraumatic.     Right Ear: External ear normal.     Left Ear: External ear normal.     Nose: Nose normal.     Mouth/Throat:     Mouth: Mucous membranes are moist.  Eyes:     Conjunctiva/sclera:  Conjunctivae normal.  Cardiovascular:     Rate and Rhythm: Normal rate.  Pulmonary:     Effort: Pulmonary effort is normal. No respiratory distress.  Musculoskeletal:        General: Normal range of motion.  Neurological:     General: No focal deficit present.     Mental Status: He is alert.  Psychiatric:        Mood and Affect: Mood normal.      UC Treatments / Results  Labs (all labs ordered are listed, but only abnormal results are displayed) Labs Reviewed  CYTOLOGY, (ORAL, ANAL, URETHRAL) ANCILLARY ONLY    EKG   Radiology No results found.  Procedures Procedures (including critical care time)  Medications Ordered in UC Medications - No data to display  Initial Impression / Assessment and Plan / UC Course  I have reviewed the triage vital signs and the nursing notes.  Pertinent labs & imaging results that were available during my care of the patient were reviewed by me and considered in my medical decision making (see chart for details).  Vitals in triage reviewed, patient is hemodynamically stable.  Dysuria occurring after intercourse, suspicious for STI exposure.  Cytology swab obtained.  HIV and syphilis screening deferred at this time.  Without testicular swelling, GU exam deferred.  Low concern for epididymitis at this time.  Staff will contact if cytology swab is abnormal to initiate appropriate treatment.  Plan of care, follow-up care return precautions given, no questions at this time.     Final Clinical Impressions(s) / UC Diagnoses   Final diagnoses:  Screening examination for sexually transmitted disease     Discharge Instructions      Today you have been screened for gonorrhea, chlamydia and trichomoniasis.  Results will be back in MyChart over the next day or so and our staff will contact you if results require treatment.  Abstain from intercourse until all results have been received.  Notify any sexual partners of any positive  results.  Return to clinic if you develop any new, concerning symptoms such as testicular swelling, fevers, nausea, or vomiting.     ED Prescriptions   None    PDMP not reviewed  this encounter.   Nakeitha Milligan, [place], [place] 05/16/23 1012

## 2023-05-16 NOTE — ED Triage Notes (Signed)
 Pt presents to the office for STD-testing. Patient reports some penile discharge.

## 2023-05-17 LAB — CYTOLOGY, (ORAL, ANAL, URETHRAL) ANCILLARY ONLY
Chlamydia: NEGATIVE
Comment: NEGATIVE
Comment: NEGATIVE
Comment: NORMAL
Neisseria Gonorrhea: NEGATIVE
Trichomonas: NEGATIVE

## 2023-05-28 ENCOUNTER — Ambulatory Visit
Admission: RE | Admit: 2023-05-28 | Discharge: 2023-05-28 | Disposition: A | Discharge status: Home / Self Care | Source: Ambulatory Visit | Attending: Family Medicine | Admitting: Family Medicine

## 2023-05-28 ENCOUNTER — Ambulatory Visit: Payer: Self-pay

## 2023-05-28 VITALS — BP 134/79 | Temp 98.7°F | Resp 16

## 2023-05-28 DIAGNOSIS — Z8619 Personal history of other infectious and parasitic diseases: Secondary | ICD-10-CM | POA: Insufficient documentation

## 2023-05-28 DIAGNOSIS — F419 Anxiety disorder, unspecified: Secondary | ICD-10-CM | POA: Diagnosis not present

## 2023-05-28 DIAGNOSIS — Z113 Encounter for screening for infections with a predominantly sexual mode of transmission: Secondary | ICD-10-CM | POA: Insufficient documentation

## 2023-05-28 NOTE — ED Triage Notes (Signed)
 Patient c/o an ache in his testicles x 3 days since his last sexual encounter.  Denies any penile discharge.  Requesting STI check.

## 2023-05-28 NOTE — Discharge Instructions (Addendum)
 Check MyChart for your test results You will be notified if any of your results are positive

## 2023-05-28 NOTE — ED Provider Notes (Signed)
 [facility]    CSN: [id number] Arrival date & time: 05/28/23  1247      History   Chief Complaint Chief Complaint  Patient presents with   SEXUALLY TRANSMITTED DISEASE    Routine check for std . Been having a few symptoms . - Entered by patient    HPI Giovannie Scerbo is a 28 y.o. male.   Patient would like to be tested for sexually transmitted disease.  He has been doing this a lot lately.  He has had 6 visits in the last couple of months for related issues.  When I inquired, he just states that he would became very upset when he had trichomonas and wants to make sure that its gone.  Now whenever he has any symptoms he feels like he needs additional trichomonas testing.  Currently denies any symptoms although he told the nurse that he had some testicular discomfort.  He told me he has no symptoms at this time.  No drainage or discharge.  No rash.  He states that he used a condom.  He states that he was with a familiar person.  As I look at his medical history he does have a diagnosis of anxiety about health and I think that he is coming frequently for reassurance because of this.    Past Medical History:  Diagnosis Date   Acute renal failure (ARF) (HCC)    Anxiety about health    CKD (chronic kidney disease)    Epididymitis    Gastro-esophageal reflux    Rhabdomyolysis    Trichomoniasis     Patient Active Problem List   Diagnosis Date Noted   Arthralgia 05/04/2023   Anxiety about health 05/04/2023   Elevated CK 04/27/2023   Gastroesophageal reflux disease 04/11/2023   History of trichomoniasis 04/11/2023   Screening for thyroid disorder 12/11/2022   Acute kidney injury (HCC) 07/08/2016   Hyperglycemia 07/07/2016   Acute renal failure (ARF) (HCC) 07/06/2016   Rhabdomyolysis 06/29/2016   CKD (chronic kidney disease), 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   Leukocytosis 06/28/2016   Metabolic acidosis 06/28/2016    Past Surgical History:  Procedure Laterality Date   ELBOW  SURGERY     UPPER GI ENDOSCOPY         Home Medications    Prior to Admission medications   Not on File    Family History Family History  Problem Relation Age of Onset   Diabetes Mother    Healthy Father    Healthy Brother     Social History Social History   Tobacco Use   Smoking status: Some Days    Current packs/day: 0.25    Types: Cigarettes, Cigars    Passive exposure: Past   Smokeless tobacco: Never  Vaping Use   Vaping status: Former  Substance Use Topics   Alcohol use: Yes    Comment: occasionally   Drug use: Not Currently    Types: Marijuana     Allergies   Patient has no known allergies.   Review of Systems Review of Systems  See HPI Physical Exam Triage Vital Signs ED Triage Vitals  Encounter Vitals Group     BP 05/28/23 1258 134/79     Systolic BP Percentile --      Diastolic BP Percentile --      Pulse --      Resp 05/28/23 1258 16     Temp 05/28/23 1258 98.7 F (37.1 C)     Temp Source 05/28/23 1258 Oral  SpO2 05/28/23 1258 97 %     Weight --      Height --      Head Circumference --      Peak Flow --      Pain Score 05/28/23 1259 0     Pain Loc --      Pain Education --      Exclude from Growth Chart --    No data found.  Updated Vital Signs BP 134/79 (BP Location: Right Arm)   Temp 98.7 F (37.1 C) (Oral)   Resp 16   SpO2 97%      Physical Exam Constitutional:      General: He is not in acute distress.    Appearance: He is well-developed and normal weight. He is not ill-appearing.  HENT:     Head: Normocephalic and atraumatic.  Eyes:     Conjunctiva/sclera: Conjunctivae normal.     Pupils: Pupils are equal, round, and reactive to light.  Cardiovascular:     Rate and Rhythm: Normal rate.  Pulmonary:     Effort: Pulmonary effort is normal. No respiratory distress.  Genitourinary:    Comments: Self swab technique is reviewed Musculoskeletal:        General: Normal range of motion.     Cervical back: Normal  range of motion.  Skin:    General: Skin is warm and dry.  Neurological:     Mental Status: He is alert.      UC Treatments / Results  Labs (all labs ordered are listed, but only abnormal results are displayed) Labs Reviewed  CYTOLOGY, (ORAL, ANAL, URETHRAL) ANCILLARY ONLY    EKG   Radiology No results found.  Procedures Procedures (including critical care time)  Medications Ordered in UC Medications - No data to display  Initial Impression / Assessment and Plan / UC Course  I have reviewed the triage vital signs and the nursing notes.  Pertinent labs & imaging results that were available during my care of the patient were reviewed by me and considered in my medical decision making (see chart for details).     Patient is reassured that trichomonas is usually very easily treated in men.  Unless he is reexposed it is unlikely that he will harbor  this infection.  Screening is performed again today at his request Final Clinical Impressions(s) / UC Diagnoses   Final diagnoses:  Screening examination for sexually transmitted disease     Discharge Instructions      Check MyChart for your test results You will be notified if any of your results are positive     ED Prescriptions   None    PDMP not reviewed this encounter.   Stephany Ehrich, MD 05/28/23 [phone]

## 2023-05-29 LAB — CYTOLOGY, (ORAL, ANAL, URETHRAL) ANCILLARY ONLY
Chlamydia: NEGATIVE
Comment: NEGATIVE
Comment: NEGATIVE
Comment: NORMAL
Neisseria Gonorrhea: NEGATIVE
Trichomonas: NEGATIVE

## 2023-06-12 DIAGNOSIS — K219 Gastro-esophageal reflux disease without esophagitis: Secondary | ICD-10-CM | POA: Diagnosis not present

## 2023-06-22 ENCOUNTER — Ambulatory Visit
Admission: RE | Admit: 2023-06-22 | Discharge: 2023-06-22 | Disposition: A | Discharge status: Home / Self Care | Payer: Self-pay | Source: Ambulatory Visit | Attending: Internal Medicine | Admitting: Internal Medicine

## 2023-06-22 VITALS — BP 127/50 | HR 65 | Temp 98.0°F | Resp 20

## 2023-06-22 DIAGNOSIS — N50812 Left testicular pain: Secondary | ICD-10-CM | POA: Insufficient documentation

## 2023-06-22 DIAGNOSIS — Z113 Encounter for screening for infections with a predominantly sexual mode of transmission: Secondary | ICD-10-CM | POA: Diagnosis not present

## 2023-06-22 MED ORDER — DOXYCYCLINE HYCLATE 100 MG PO CAPS: 100.0000 mg | ORAL_CAPSULE | Freq: Two times a day (BID) | ORAL | 0 refills | Status: DC

## 2023-06-22 NOTE — ED Provider Notes (Signed)
 [facility]    CSN: [id number] Arrival date & time: 06/22/23  1327      History   Chief Complaint Chief Complaint  Patient presents with   SEXUALLY TRANSMITTED DISEASE    routine testing . test for std and hiv. and need a referral for testing for epididymitis I was treated but dont think it has gone away fully. - Entered by patient   sti testing    HPI Caleb Morrison is a 28 y.o. male.   Patient presents for STD testing and to "make sure epididymitis is clear".  Patient was seen in March and had ultrasound that showed epididymitis in the right testicle.  He reports that he received an injection and took antibiotic as prescribed with minimal improvement in pain.  Epididymitis was found in the right testicle but patient is now complaining of intermittent mild pain in the left testicle.  He denies any obvious swelling or discoloration.  He last had intercourse approximately 1 month ago and denies any exposure to STD. States that he does use protection with intercourse.  Denies any other associated symptoms including penile discharge or dysuria.  Patient has been seen frequently for STD testing.  He reports that he was previously diagnosed with trichomonas and is now anxious about getting another STD.     Past Medical History:  Diagnosis Date   Acute renal failure (ARF) (HCC)    Anxiety about health    CKD (chronic kidney disease)    Epididymitis    Gastro-esophageal reflux    Rhabdomyolysis    Trichomoniasis     Patient Active Problem List   Diagnosis Date Noted   Arthralgia 05/04/2023   Anxiety about health 05/04/2023   Elevated CK 04/27/2023   Gastroesophageal reflux disease 04/11/2023   History of trichomoniasis 04/11/2023   Screening for thyroid disorder 12/11/2022   Acute kidney injury (HCC) 07/08/2016   Hyperglycemia 07/07/2016   Acute renal failure (ARF) (HCC) 07/06/2016   Rhabdomyolysis 06/29/2016   CKD (chronic kidney disease), 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 Authorizing Provider  doxycycline  (VIBRAMYCIN ) 100 MG capsule Take 1 capsule (100 mg total) by mouth 2 (two) times daily. 06/22/23  Yes Breeanna Galgano, [place] E, FNP  lansoprazole  (PREVACID ) 30 MG capsule Take 30 mg by mouth. 06/12/23 03/08/24 Yes [provider]    Family History Family History  Problem Relation Age of Onset   Diabetes Mother    Healthy Father    Healthy Brother     Social History Social History   Tobacco Use   Smoking status: Some Days    Current packs/day: 0.25    Types: Cigarettes, Cigars    Passive exposure: Past   Smokeless tobacco: Never  Vaping Use   Vaping status: Former  Substance Use Topics   Alcohol use: Yes    Comment: occasionally   Drug use: Not Currently    Types: Marijuana     Allergies   Patient has no known allergies.   Review of Systems Review of Systems Per HPI  Physical Exam Triage Vital Signs ED Triage Vitals  Encounter Vitals Group     BP 06/22/23 1332 (!) 127/50     Systolic  BP Percentile --      Diastolic BP Percentile --      Pulse Rate 06/22/23 1332 65     Resp 06/22/23 1332 20     Temp 06/22/23 1332 98 F (36.7 C)     Temp Source 06/22/23 1332 Oral     SpO2 06/22/23 1332 96 %     Weight --      Height --      Head Circumference --      Peak Flow --      Pain Score 06/22/23 1331 0     Pain Loc --      Pain Education --      Exclude from Growth Chart --    No data found.  Updated Vital Signs BP (!) 127/50 (BP Location: Right Arm)   Pulse 65   Temp 98 F (36.7 C) (Oral)   Resp 20   SpO2 96%   Visual Acuity Right Eye Distance:   Left Eye Distance:   Bilateral Distance:    Right Eye Near:   Left Eye Near:    Bilateral Near:     Physical  Exam Constitutional:      General: He is not in acute distress.    Appearance: Normal appearance. He is not toxic-appearing or diaphoretic.  HENT:     Head: Normocephalic and atraumatic.  Eyes:     Extraocular Movements: Extraocular movements intact.     Conjunctiva/sclera: Conjunctivae normal.  Pulmonary:     Effort: Pulmonary effort is normal.  Genitourinary:    Comments: Patient declined. Self swab performed.  Neurological:     General: No focal deficit present.     Mental Status: He is alert and oriented to person, place, and time. Mental status is at baseline.  Psychiatric:        Mood and Affect: Mood normal.        Behavior: Behavior normal.        Thought Content: Thought content normal.        Judgment: Judgment normal.      UC Treatments / Results  Labs (all labs ordered are listed, but only abnormal results are displayed) Labs Reviewed  RPR  HIV ANTIBODY (ROUTINE TESTING W REFLEX)  CYTOLOGY, (ORAL, ANAL, URETHRAL) ANCILLARY ONLY    EKG   Radiology No results found.  Procedures Procedures (including critical care time)  Medications Ordered in UC Medications - No data to display  Initial Impression / Assessment and Plan / UC Course  I have reviewed the triage vital signs and the nursing notes.  Pertinent labs & imaging results that were available during my care of the patient were reviewed by me and considered in my medical decision making (see chart for details).     Patient declined evaluation of testicle.  He reports there is no swelling which is reassuring.  Patient requesting ultrasound to ensure epididymitis is clear so this was ordered at [facility] med center.  Currently awaiting results.  Discussed patient's treatment with colleague MD given all of his STD testing has been negative at recent visits.  This MD advised to defer Rocephin  at this time given there has been no known recent STD exposure.  Will treat with doxycycline  while awaiting  other results given STD is typical cause of epididymitis in this age group.  Cytology swab pending.  Patient requested HIV and syphilis testing but clinical staff was not able to obtain blood work.  I do not have the ability to  order outpatient labs so the patient is to follow-up with PCP for this testing.  Will await results of ultrasound and cytology swab for further treatment.  Given chronicity of issue, recommended urology evaluation so ambulatory referral to urology was placed today as well.  Encouraged strict ER precautions as well.  Patient verbalized understanding and was agreeable with plan.  Final Clinical Impressions(s) / UC Diagnoses   Final diagnoses:  Left testicular pain  Screening examination for venereal disease     Discharge Instructions      I have placed an order for a scrotal ultrasound. Please go to [facility] as soon as you leave urgent care to have this completed. I will call if it is abnormal. STD testing is pending. I have prescribed you an antibiotic to take as well. Urology referral has been placed. If they do not call you, please call them yourself.   ED Prescriptions     Medication Sig Dispense Auth. Provider   doxycycline  (VIBRAMYCIN ) 100 MG capsule Take 1 capsule (100 mg total) by mouth 2 (two) times daily. 20 capsule [employer] E, [place]      PDMP not reviewed this encounter.   Dodson [place], [place] 06/22/23 [phone]

## 2023-06-22 NOTE — ED Triage Notes (Signed)
 Pt c/o aching in left testicle x1 month.Wants STI testing.

## 2023-06-22 NOTE — Discharge Instructions (Signed)
 I have placed an order for a scrotal ultrasound. Please go to [facility] as soon as you leave urgent care to have this completed. I will call if it is abnormal. STD testing is pending. I have prescribed you an antibiotic to take as well. Urology referral has been placed. If they do not call you, please call them yourself.

## 2023-06-24 ENCOUNTER — Telehealth: Payer: Self-pay | Admitting: Internal Medicine

## 2023-06-24 NOTE — Telephone Encounter (Signed)
 Attempted to call patient given it does not appear that he went to get scrotum ultrasound completed that was ordered.There was no answer. LVM for a call back per privacy policy.

## 2023-06-25 LAB — CYTOLOGY, (ORAL, ANAL, URETHRAL) ANCILLARY ONLY
Chlamydia: NEGATIVE
Comment: NEGATIVE
Comment: NEGATIVE
Comment: NORMAL
Neisseria Gonorrhea: NEGATIVE
Trichomonas: NEGATIVE

## 2023-06-27 ENCOUNTER — Telehealth: Payer: Self-pay | Admitting: Internal Medicine

## 2023-06-27 NOTE — Telephone Encounter (Signed)
 Attempted to call patient again to inquire about scrotum ultrasound but there was no answer.

## 2023-07-11 ENCOUNTER — Ambulatory Visit (INDEPENDENT_AMBULATORY_CARE_PROVIDER_SITE_OTHER): Admitting: Family Medicine

## 2023-07-11 ENCOUNTER — Encounter: Payer: Self-pay | Admitting: Family Medicine

## 2023-07-11 VITALS — BP 130/79 | HR 85 | Temp 99.3°F | Ht 74.0 in | Wt 234.0 lb

## 2023-07-11 DIAGNOSIS — N50811 Right testicular pain: Secondary | ICD-10-CM | POA: Diagnosis not present

## 2023-07-11 DIAGNOSIS — R0789 Other chest pain: Secondary | ICD-10-CM | POA: Diagnosis not present

## 2023-07-11 DIAGNOSIS — Z7251 High risk heterosexual behavior: Secondary | ICD-10-CM | POA: Insufficient documentation

## 2023-07-11 DIAGNOSIS — N50812 Left testicular pain: Secondary | ICD-10-CM | POA: Diagnosis not present

## 2023-07-11 NOTE — Assessment & Plan Note (Signed)
 Ultrasound ordered by another provider. Missed that appointment. Will  reschedule today. Symptoms present since being diagnosed with epididymitis weeks ago.

## 2023-07-11 NOTE — Assessment & Plan Note (Signed)
 Recheck HIV 3 months after unprotected intercourse.

## 2023-07-11 NOTE — Progress Notes (Signed)
   Established Patient Office Visit  Subjective   Patient ID: Caleb Morrison, male    DOB: Feb 04, 1996  Age: 28 y.o. MRN: [id number]  Chief Complaint  Patient presents with   Testicle Pain    Wants referral for an ultrasound. Missed last appointment    Muscle aches    Left side of chest to side aches x 2 weeks    Testicular pain: Has been treated for epididymitis and continues to have pain worse on right side than left.  Symptoms present for some time now. Ultrasound ordered, missed appointment.  Muscle aches on left side of chest. Worse with movement. Ache that comes and goes and moves around chest.  Lifts weights, symptoms started after doing upper body work-out.  Present for 3 weeks. No pain with exercise. Ran without chest pain. No shortness of breath.  No nausea.  No diaphoresis.   HIV testing today. Last lab unable to find vein.     Chart review; 07/09/23: urgent care for rash on penis: Keflex 500 mg BID for 10 days Mupirocin 2% ointment Blood pressure elevated 181/76 06/22/23: negative for gonorrhea, chlamydia, trich  Unable to get blood for HIV at that visit.  ROS    Objective:     BP 130/79 (BP Location: Left Arm, Patient Position: Sitting, Cuff Size: Large)   Pulse 85   Temp 99.3 F (37.4 C) (Oral)   Ht 6\' 2"  (1.88 m)   Wt 234 lb (106.1 kg)   SpO2 95%   BMI 30.04 kg/m  BP Readings from Last 3 Encounters:  07/11/23 130/79  06/22/23 (!) 127/50  05/28/23 134/79      Physical Exam Chest:     Chest wall: No mass, deformity, swelling or tenderness.       Comments: No bruising or pain with palpation.     No results found for any visits on 07/11/23.    The ASCVD Risk score (Arnett DK, et al., 2019) failed to calculate for the following reasons:   The 2019 ASCVD risk score is only valid for ages 30 to 100    Assessment & Plan:   Problem List Items Addressed This Visit     Pain in both testicles - Primary   Ultrasound ordered by another  provider. Missed that appointment. Will  reschedule today. Symptoms present since being diagnosed with epididymitis weeks ago.       Unprotected sexual intercourse   Recheck HIV 3 months after unprotected intercourse.       Relevant Orders   HIV Antibody (routine testing w rflx)   Atypical chest pain   Pain intermittently on left side of chest that radiates. Excellent exercise tolerance. Runs without chest pain or shortness of breath. No pain on palpation today. Lifts weights which could have caused an injury. Chest x-ray today. If negative, will recommend NSAIDs and heating pad. No red flag chest pain symptoms today.        Relevant Orders   DG Chest 2 View  Agrees with plan of care discussed.  Questions answered.   Return if symptoms worsen or fail to improve.    Mickiel Albany, FNP

## 2023-07-11 NOTE — Assessment & Plan Note (Signed)
 Pain intermittently on left side of chest that radiates. Excellent exercise tolerance. Runs without chest pain or shortness of breath. No pain on palpation today. Lifts weights which could have caused an injury. Chest x-ray today. If negative, will recommend NSAIDs and heating pad. No red flag chest pain symptoms today.

## 2023-07-11 NOTE — Patient Instructions (Signed)
 Call [phone] to schedule ultrasound.

## 2023-07-12 ENCOUNTER — Ambulatory Visit: Payer: Self-pay | Admitting: Family Medicine

## 2023-07-12 LAB — HIV ANTIBODY (ROUTINE TESTING W REFLEX): HIV Screen 4th Generation wRfx: NONREACTIVE

## 2023-09-20 ENCOUNTER — Other Ambulatory Visit: Payer: Self-pay

## 2023-09-20 ENCOUNTER — Ambulatory Visit
Admission: RE | Admit: 2023-09-20 | Discharge: 2023-09-20 | Disposition: A | Discharge status: Home / Self Care | Attending: Emergency Medicine | Admitting: Emergency Medicine

## 2023-09-20 VITALS — BP 132/92 | HR 81 | Temp 98.2°F | Resp 16

## 2023-09-20 DIAGNOSIS — Z113 Encounter for screening for infections with a predominantly sexual mode of transmission: Secondary | ICD-10-CM | POA: Insufficient documentation

## 2023-09-20 NOTE — ED Triage Notes (Signed)
 Patient states  I need routine STD testing Patient denies symptoms at this time.

## 2023-09-20 NOTE — ED Provider Notes (Signed)
 [facility]    CSN: [id number] Arrival date & time: 09/20/23  1756      History   Chief Complaint Chief Complaint  Patient presents with   SEXUALLY TRANSMITTED DISEASE    Just want to be routine tested for std and hiv . - Entered by patient   Exposure to STD    HPI C[facility] is a 28 y.o. male.   Patient presents to clinic requesting sexually-transmitted infection screening.  Reports this is routine testing and is asymptomatic.  Has not had any penile discharge, dysuria or scrotal swelling.  Initially was interested in HIV and syphilis screening, did have this done 2 months prior and that was negative.  The history is provided by the patient and medical records.  Exposure to STD    Past Medical History:  Diagnosis Date   Acute renal failure (ARF) (HCC)    Anxiety about health    CKD (chronic kidney disease)    Epididymitis    Gastro-esophageal reflux    Rhabdomyolysis    Trichomoniasis     Patient Active Problem List   Diagnosis Date Noted   Pain in both testicles 07/11/2023   Unprotected sexual intercourse 07/11/2023   Atypical chest pain 07/11/2023   Arthralgia 05/04/2023   Anxiety about health 05/04/2023   Elevated CK 04/27/2023   Gastroesophageal reflux disease 04/11/2023   History of trichomoniasis 04/11/2023   Screening for thyroid  disorder 12/11/2022   Acute kidney injury (HCC) 07/08/2016   Hyperglycemia 07/07/2016   Acute renal failure (ARF) (HCC) 07/06/2016   Rhabdomyolysis 06/29/2016   CKD (chronic kidney disease), 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 Authorizing Provider  lansoprazole  (PREVACID ) 30 MG capsule Take 30 mg by mouth. 06/12/23 03/08/24  [provider]    Family  History Family History  Problem Relation Age of Onset   Diabetes Mother    Healthy Father    Healthy Brother     Social History Social History   Tobacco Use   Smoking status: Some Days    Current packs/day: 0.25    Types: Cigarettes, Cigars    Passive exposure: Past   Smokeless tobacco: Never  Vaping Use   Vaping status: Former  Substance Use Topics   Alcohol use: Yes    Comment: occasionally   Drug use: Not Currently    Types: Marijuana     Allergies   Patient has no known allergies.   Review of Systems Review of Systems  Per HPI  Physical Exam Triage Vital Signs ED Triage Vitals  Encounter Vitals Group     BP 09/20/23 1810 (!) 132/92     Girls Systolic BP Percentile --      Girls Diastolic BP Percentile --      Boys Systolic BP Percentile --      Boys Diastolic BP Percentile --      Pulse Rate 09/20/23 1810 81     Resp 09/20/23 1810 16     Temp 09/20/23 1808 98.2 F (36.8 C)     Temp Source 09/20/23 1808 Oral     SpO2 09/20/23 1810 95 %     Weight --      Height --  Head Circumference --      Peak Flow --      Pain Score 09/20/23 1810 0     Pain Loc --      Pain Education --      Exclude from Growth Chart --    No data found.  Updated Vital Signs BP (!) 132/92 (BP Location: Right Arm)   Pulse 81   Temp 98.2 F (36.8 C) (Oral)   Resp 16   SpO2 95%   Visual Acuity Right Eye Distance:   Left Eye Distance:   Bilateral Distance:    Right Eye Near:   Left Eye Near:    Bilateral Near:     Physical Exam Vitals and nursing note reviewed.  Constitutional:      Appearance: Normal appearance.  HENT:     Head: Normocephalic and atraumatic.     Right Ear: External ear normal.     Left Ear: External ear normal.     Nose: Nose normal.     Mouth/Throat:     Mouth: Mucous membranes are moist.  Eyes:     Conjunctiva/sclera: Conjunctivae normal.  Cardiovascular:     Rate and Rhythm: Normal rate.  Pulmonary:     Effort: Pulmonary effort  is normal. No respiratory distress.  Neurological:     General: No focal deficit present.     Mental Status: He is alert and oriented to person, place, and time.  Psychiatric:        Mood and Affect: Mood normal.        Behavior: Behavior normal.      UC Treatments / Results  Labs (all labs ordered are listed, but only abnormal results are displayed) Labs Reviewed - No data to display  EKG   Radiology No results found.  Procedures Procedures (including critical care time)  Medications Ordered in UC Medications - No data to display  Initial Impression / Assessment and Plan / UC Course  I have reviewed the triage vital signs and the nursing notes.  Pertinent labs & imaging results that were available during my care of the patient were reviewed by me and considered in my medical decision making (see chart for details).  Vitals and triage reviewed, patient is hemodynamically stable.  Asymptomatic STI screening.  HIV and syphilis screening done 2 months prior were negative, advised to repeat in 6 to 12 months from previous result if new sexual partners.  Plan of care, follow-up care return precautions given, no questions at this time.    Final Clinical Impressions(s) / UC Diagnoses   Final diagnoses:  Encounter for screening examination for sexually transmitted infection     Discharge Instructions      Your screen for gonorrhea, chlamydia and trichomoniasis today in clinic.  Results will be available over the next few days on your MyChart and staff will contact you if results are abnormal to start the appropriate treatment.  Abstain from intercourse until results have been received.  Return to clinic for any new or urgent symptoms.     ED Prescriptions   None    PDMP not reviewed this encounter.   Dreama Suzanna SAILOR, FNP 09/20/23 1828

## 2023-09-20 NOTE — Discharge Instructions (Addendum)
 Your screen for gonorrhea, chlamydia and trichomoniasis today in clinic.  Results will be available over the next few days on your MyChart and staff will contact you if results are abnormal to start the appropriate treatment.  Abstain from intercourse until results have been received.  Return to clinic for any new or urgent symptoms.

## 2023-09-21 LAB — CYTOLOGY, (ORAL, ANAL, URETHRAL) ANCILLARY ONLY
Chlamydia: NEGATIVE
Comment: NEGATIVE
Comment: NEGATIVE
Comment: NORMAL
Neisseria Gonorrhea: NEGATIVE
Trichomonas: NEGATIVE

## 2023-09-23 ENCOUNTER — Ambulatory Visit
Admission: RE | Admit: 2023-09-23 | Discharge: 2023-09-23 | Disposition: A | Discharge status: Home / Self Care | Source: Ambulatory Visit | Attending: Family Medicine | Admitting: Family Medicine

## 2023-09-23 VITALS — BP 133/82 | HR 71 | Temp 97.7°F | Resp 16

## 2023-09-23 DIAGNOSIS — N451 Epididymitis: Secondary | ICD-10-CM | POA: Diagnosis not present

## 2023-09-23 DIAGNOSIS — Z113 Encounter for screening for infections with a predominantly sexual mode of transmission: Secondary | ICD-10-CM | POA: Insufficient documentation

## 2023-09-23 DIAGNOSIS — R4589 Other symptoms and signs involving emotional state: Secondary | ICD-10-CM | POA: Insufficient documentation

## 2023-09-23 MED ORDER — CEFTRIAXONE SODIUM 500 MG IJ SOLR
500.0000 mg | INTRAMUSCULAR | Status: DC
  Administered 2023-09-23: 500 mg via INTRAMUSCULAR

## 2023-09-23 MED ORDER — DOXYCYCLINE HYCLATE 100 MG PO TABS: 100.0000 mg | ORAL_TABLET | Freq: Two times a day (BID) | ORAL | 0 refills | Status: AC

## 2023-09-23 NOTE — ED Provider Notes (Signed)
 [facility]    CSN: [id number] Arrival date & time: 09/23/23  0825    HISTORY   Chief Complaint  Patient presents with   Testicle Pain    I need an antibiotic treatment for Epididymitis. Been in mild severe pain past 2 days . - Entered by patient   HPI [facility] is a pleasant, 28 y.o. male who presents to urgent care today. Patient complains of testicle pain, right greater than left.  Patient states he was treated for epididymitis which was diagnosed via ultrasound at [facility] in May of this year.  Patient states he took all antibiotics as prescribed but did not have complete resolution of his testicular pain.  Patient was seen again for this issue in June, was again treated with 7-day course of doxycycline .  Ultrasound was ordered by provider however patient did not follow-up to have this done.  Patient was seen by his PCP 2 days ago with the same complaint, STD testing was negative.  Patient was again advised to have ultrasound done, he has not at this done yet. Patient endorses concern for STD exposure.  States he is consistent with using condoms while having sexual intercourse.  States last sexual intercourse was 4 days ago.  Patient states he continues to be physically active, likes to run and lift weights. Patient denies perineal pain, rectal pain, pain with defecation, penile discharge, genital lesion , burning during urination, flank pain, fever , body aches, chills, rigors, malaise, and significant fatigue.   The history is provided by the patient.  Testicle Pain    Past Medical History:  Diagnosis Date   Acute renal failure (ARF) (HCC)    Anxiety about health    CKD (chronic kidney disease)    Epididymitis    Gastro-esophageal reflux    Rhabdomyolysis    Trichomoniasis    Patient Active Problem List   Diagnosis Date Noted   Pain in both testicles 07/11/2023   Unprotected sexual intercourse 07/11/2023   Atypical chest pain 07/11/2023   Arthralgia  05/04/2023   Anxiety about health 05/04/2023   Elevated CK 04/27/2023   Gastroesophageal reflux disease 04/11/2023   History of trichomoniasis 04/11/2023   Screening for thyroid  disorder 12/11/2022   Acute kidney injury (HCC) 07/08/2016   Hyperglycemia 07/07/2016   Acute renal failure (ARF) (HCC) 07/06/2016   Rhabdomyolysis 06/29/2016   CKD (chronic kidney disease), 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 Authorizing Provider  doxycycline  (VIBRA -TABS) 100 MG tablet Take 1 tablet (100 mg total) by mouth 2 (two) times daily for 10 days. 09/23/23 10/03/23 Yes Joesph Shaver Scales, PA-C  lansoprazole  (PREVACID ) 30 MG capsule Take 30 mg by mouth. 06/12/23 03/08/24  [provider]    Family History Family History  Problem Relation Age of Onset   Diabetes Mother    Healthy Father    Healthy Brother    Social History Social History   Tobacco Use   Smoking status: Some Days    Current packs/day: 0.25    Types: Cigarettes, Cigars    Passive exposure: Past   Smokeless tobacco: Never  Vaping Use   Vaping status: Former  Substance Use Topics   Alcohol use: Yes  Comment: occasionally   Drug use: Not Currently    Types: Marijuana   Allergies   Patient has no known allergies.  Review of Systems Review of Systems  Genitourinary:  Positive for testicular pain.   Pertinent findings revealed after performing a 14 point review of systems has been noted in the history of present illness.  Physical Exam Vital Signs BP 133/82 (BP Location: Right Arm)   Pulse 71   Temp 97.7 F (36.5 C) (Oral)   Resp 16   SpO2 96%   No data found.  Physical Exam Constitutional:      General: He is not in acute distress.    Appearance: Normal  appearance. He is not toxic-appearing or diaphoretic.  HENT:     Head: Normocephalic and atraumatic.  Eyes:     Extraocular Movements: Extraocular movements intact.     Conjunctiva/sclera: Conjunctivae normal.  Pulmonary:     Effort: Pulmonary effort is normal.  Genitourinary:    Comments: Patient declined. Self swab performed.  Neurological:     General: No focal deficit present.     Mental Status: He is alert and oriented to person, place, and time. Mental status is at baseline.  Psychiatric:        Mood and Affect: Mood normal.        Behavior: Behavior normal.        Thought Content: Thought content normal.        Judgment: Judgment normal.     Visual Acuity Right Eye Distance:   Left Eye Distance:   Bilateral Distance:    Right Eye Near:   Left Eye Near:    Bilateral Near:     UC Couse / Diagnostics / Procedures:     Radiology No results found.  Procedures Procedures (including critical care time) EKG  Pending results:  Labs Reviewed  CYTOLOGY, (ORAL, ANAL, URETHRAL) ANCILLARY ONLY    Medications Ordered in UC: Medications - No data to display   UC Diagnoses / Final Clinical Impressions(s)   I have reviewed the triage vital signs and the nursing notes.  Pertinent labs & imaging results that were available during my care of the patient were reviewed by me and considered in my medical decision making (see chart for details).    Final diagnoses:  Epididymitis, right  Anxiety about health  Screening examination for STD (sexually transmitted disease)   STD testing repeated given that patient had sex 4 days ago and was only tested 2 days ago.  Patient encouraged to be sure that he has ultrasound of the scrotum performed ASAP.  While patient is here, patient is requesting repeat treatment for epididymitis because he has not had resolution of his symptoms.  Patient states that he states he has been told that sometimes more than 1 treatment is required.  Patient  advised that I do not think that his epididymitis is due to infectious process but I also see that he was only treated for 7 days instead of 10 so we will repeat a 10-day course of doxycycline .  Patient advised if scrotal ultrasound reveals epididymitis but STD testing is negative it is most likely that his epididymitis is due to trauma from running and weightlifting.  Discussed investing in underclothing that is supportive of his scrotum while exercising.  Patient again encouraged to consider counseling for his anxiety about his health.  Please see discharge instructions below for details of plan of care as provided to patient. ED Prescriptions  Medication Sig Dispense Auth. Provider   doxycycline  (VIBRA -TABS) 100 MG tablet Take 1 tablet (100 mg total) by mouth 2 (two) times daily for 10 days. 20 tablet Joesph Shaver Scales, PA-C      PDMP not reviewed this encounter.  Disposition Upon Discharge:  Condition: stable for discharge home  Patient presented with concern for an acute illness with associated systemic symptoms and significant discomfort requiring urgent management. In my opinion, this is a condition that a prudent lay person (someone who possesses an average knowledge of health and medicine) may potentially expect to result in complications if not addressed urgently such as respiratory distress, impairment of bodily function or dysfunction of bodily organs.   As such, the patient has been evaluated and assessed, work-up was performed and treatment was provided in alignment with urgent care protocols and evidence based medicine.  Patient/parent/caregiver has been advised that the patient may require follow up for further testing and/or treatment if the symptoms continue in spite of treatment, as clinically indicated and appropriate.  Routine symptom specific, illness specific and/or disease specific instructions were discussed with the patient and/or caregiver at length.  Prevention  strategies for avoiding STD exposure were also discussed.  The patient will follow up with their current PCP if and as advised. If the patient does not currently have a PCP we will assist them in obtaining one.   The patient may need specialty follow up if the symptoms continue, in spite of conservative treatment and management, for further workup, evaluation, consultation and treatment as clinically indicated and appropriate.  Patient/parent/caregiver verbalized understanding and agreement of plan as discussed.  All questions were addressed during visit.  Please see discharge instructions below for further details of plan.    Discharge Instructions      As we discussed today, it is possible that your epididymitis is non-infectious.  Your STD testing from 2 days ago was negative.  Because you had been sexually active approximately 1 week prior to that test, it is still possible that you could test positive because sometimes it can take up to 3 weeks after exposure to an STD for results to be reliable.  For this reason, we have repeated your STD testing for chlamydia, gonorrhea and trichomonas.  We will contact you with those results tomorrow afternoon.  We provided you with complete empiric treatment for infectious epididymitis today which included an injection of ceftriaxone  500 mg and a 10-day course of doxycycline  100 mg twice daily.  If you do not have resolution of your testicular discomfort after completing doxycycline , I think you can consider your epididymitis to be noninfectious.  I strongly recommend that you go to the [place] imaging center to get your scrotal ultrasound done.  It be important to know whether or not the epididymitis is still present, as compared to the scrotal ultrasound you had done at [facility] a few months ago.    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    The company I was telling you about that makes supportive underwear for regular daytime use is called [employer].  You can look them up online.  Please be sure that you are using firm scrotal support when you are doing cardio or performing any other vigorous activities.  Most sporting goods stores sell these.  You are also welcome to take ibuprofen  (  Motrin , Advil ) 400 to 600 mg (2 to 3 tablets) every 6-8 hours as needed OR naproxen  (Aleve ) 440 mg (2 tablets) every 8-12 hours as needed for testicular pain.  If none of these recommendations have been effective and you continue to have pain, the neck step is to follow-up with a urologist for further evaluation.  Thank you for visiting [facility] urgent care.  We appreciate the opportunity to participate in your care.    This office note has been dictated using Teaching laboratory technician.  Unfortunately, this method of dictation can sometimes lead to typographical or grammatical errors.  I apologize for your inconvenience in advance if this occurs.  Please do not hesitate to reach out to me if clarification is needed.       Joesph Shaver Scales, [place] 09/24/23 [phone]

## 2023-09-23 NOTE — ED Triage Notes (Signed)
 Pt reports testicle pain x 3 days. States he was recently seen here on the 8/14 for STD testing. States results were negative. Feels like symptoms are similar to a past episode of epididymitis. States feels like testicles are heavy and swollen.

## 2023-09-23 NOTE — Discharge Instructions (Signed)
 As we discussed today, it is possible that your epididymitis is non-infectious.  Your STD testing from 2 days ago was negative.  Because you had been sexually active approximately 1 week prior to that test, it is still possible that you could test positive because sometimes it can take up to 3 weeks after exposure to an STD for results to be reliable.  For this reason, we have repeated your STD testing for chlamydia, gonorrhea and trichomonas.  We will contact you with those results tomorrow afternoon.  We provided you with complete empiric treatment for infectious epididymitis today which included an injection of ceftriaxone  500 mg and a 10-day course of doxycycline  100 mg twice daily.  If you do not have resolution of your testicular discomfort after completing doxycycline , I think you can consider your epididymitis to be noninfectious.  I strongly recommend that you go to the [place] [facility] center to get your scrotal ultrasound done.  It be important to know whether or not the epididymitis is still present, as compared to the scrotal ultrasound you had done at [facility] a few months ago.    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    The company I was telling you about that makes supportive underwear for regular daytime use is called [employer].  You can look them up online.  Please be sure that you are using firm scrotal support when you are doing cardio or performing any other vigorous activities.  Most sporting goods stores sell these.  You are also welcome to take ibuprofen  (Motrin , Advil ) 400 to 600 mg (2 to 3 tablets) every 6-8 hours as needed OR naproxen  (Aleve ) 440 mg (2 tablets) every 8-12 hours as needed for testicular pain.  If none of these recommendations have been effective and you continue to have pain, the neck  step is to follow-up with a urologist for further evaluation.  Thank you for visiting [facility] urgent care.  We appreciate the opportunity to participate in your care.

## 2023-09-24 ENCOUNTER — Ambulatory Visit: Payer: Self-pay | Admitting: Emergency Medicine

## 2023-09-24 LAB — CYTOLOGY, (ORAL, ANAL, URETHRAL) ANCILLARY ONLY
Chlamydia: NEGATIVE
Comment: NEGATIVE
Comment: NEGATIVE
Comment: NORMAL
Neisseria Gonorrhea: NEGATIVE
Trichomonas: NEGATIVE

## 2023-09-24 NOTE — Progress Notes (Signed)
 Your STD testing was negative

## 2023-10-31 ENCOUNTER — Ambulatory Visit: Payer: Self-pay

## 2023-11-07 ENCOUNTER — Ambulatory Visit
Admission: RE | Admit: 2023-11-07 | Discharge: 2023-11-07 | Disposition: A | Discharge status: Home / Self Care | Payer: Self-pay | Attending: Internal Medicine | Admitting: Internal Medicine

## 2023-11-07 VITALS — BP 131/84 | HR 65 | Temp 98.2°F | Resp 18

## 2023-11-07 DIAGNOSIS — R300 Dysuria: Secondary | ICD-10-CM | POA: Diagnosis not present

## 2023-11-07 DIAGNOSIS — Z7251 High risk heterosexual behavior: Secondary | ICD-10-CM | POA: Insufficient documentation

## 2023-11-07 LAB — POCT URINE DIPSTICK
Bilirubin, UA: NEGATIVE
Glucose, UA: NEGATIVE mg/dL
Ketones, POC UA: NEGATIVE mg/dL
Leukocytes, UA: NEGATIVE
Nitrite, UA: NEGATIVE
Spec Grav, UA: 1.02 (ref 1.010–1.025)
Urobilinogen, UA: 1 U/dL
pH, UA: 7 (ref 5.0–8.0)

## 2023-11-07 NOTE — ED Provider Notes (Signed)
 [name]    CSN: [id number] Arrival date & time: 11/07/23  0827      History   Chief Complaint Chief Complaint  Patient presents with   SEXUALLY TRANSMITTED DISEASE    Difficulty peeing without pain or burning sensation. - Entered by patient    HPI Caleb Morrison is a 28 y.o. male presents requesting STD testing after having unprotected sex 5 days ago  with current monogamous male partner and is having burning with urination at the end of his penis x 3 days. He denies penile discharge. His male partner gets tested for STD regularly as well and both decided to not use condoms lately.     Past Medical History:  Diagnosis Date   Acute renal failure (ARF)    Anxiety about health    CKD (chronic kidney disease)    Epididymitis    Gastro-esophageal reflux    Rhabdomyolysis    Trichomoniasis     Patient Active Problem List   Diagnosis Date Noted   Pain in both testicles 07/11/2023   Unprotected sexual intercourse 07/11/2023   Atypical chest pain 07/11/2023   Arthralgia 05/04/2023   Anxiety about health 05/04/2023   Elevated CK 04/27/2023   Gastroesophageal reflux disease 04/11/2023   History of trichomoniasis 04/11/2023   Screening for thyroid  disorder 12/11/2022   Acute kidney injury 07/08/2016   Hyperglycemia 07/07/2016   Acute renal failure (ARF) 07/06/2016   Rhabdomyolysis 06/29/2016   CKD (chronic kidney disease), 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 Authorizing Provider  lansoprazole  (PREVACID ) 30 MG capsule Take 30 mg by mouth. 06/12/23 03/08/24  [provider]    Family History Family History  Problem Relation Age of Onset   Diabetes Mother    Healthy Father    Healthy Brother     Social  History Social History   Tobacco Use   Smoking status: Some Days    Current packs/day: 0.25    Types: Cigarettes, Cigars    Passive exposure: Past   Smokeless tobacco: Never  Vaping Use   Vaping status: Former  Substance Use Topics   Alcohol use: Yes    Comment: occasionally   Drug use: Not Currently    Types: Marijuana     Allergies   Patient has no known allergies.   Review of Systems Review of Systems  As noted in HPI  Physical Exam Triage Vital Signs ED Triage Vitals [11/07/23 0835]  Encounter Vitals Group     BP      Girls Systolic BP Percentile      Girls Diastolic BP Percentile      Boys Systolic BP Percentile      Boys Diastolic BP Percentile      Pulse      Resp      Temp      Temp src      SpO2      Weight      Height      Head Circumference      Peak Flow      Pain Score 0     Pain Loc      Pain Education      Exclude from  Growth Chart    No data found.  Updated Vital Signs BP 131/84 (BP Location: Right Arm)   Pulse 65   Temp 98.2 F (36.8 C) (Oral)   Resp 18   SpO2 96%   Visual Acuity Right Eye Distance:   Left Eye Distance:   Bilateral Distance:    Right Eye Near:   Left Eye Near:    Bilateral Near:     Physical Exam Vitals and nursing note reviewed.  Constitutional:      General: He is not in acute distress. HENT:     Right Ear: External ear normal.     Left Ear: External ear normal.  Eyes:     General: No scleral icterus.    Conjunctiva/sclera: Conjunctivae normal.  Pulmonary:     Effort: Pulmonary effort is normal.  Musculoskeletal:        General: Normal range of motion.     Cervical back: Neck supple.  Neurological:     Mental Status: He is alert and oriented to person, place, and time.     Gait: Gait normal.  Psychiatric:        Mood and Affect: Mood normal.        Behavior: Behavior normal.        Thought Content: Thought content normal.        Judgment: Judgment normal.      UC Treatments / Results   Labs (all labs ordered are listed, but only abnormal results are displayed) Labs Reviewed  POCT URINE DIPSTICK - Abnormal; Notable for the following components:      Result Value   Blood, UA trace-intact (*)    All other components within normal limits  CYTOLOGY, (ORAL, ANAL, URETHRAL) ANCILLARY ONLY    EKG   Radiology No results found.  Procedures Procedures (including critical care time)  Medications Ordered in UC Medications - No data to display  Initial Impression / Assessment and Plan / UC Course  I have reviewed the triage vital signs and the nursing notes.  STD screen Dysuria  Pertinent labs  results that were available during my care of the patient were reviewed by me and considered in my medical decision making (see chart for details). UA shows trace blood, the rest is negative.   Urine was sent out for a culture as well as the STD swab test. We will inform him if the tests come back positive.      Final Clinical Impressions(s) / UC Diagnoses   Final diagnoses:  Unprotected sexual intercourse  Dysuria     Discharge Instructions      Your urine shows trace blood, so I will send it for a culture to see if there is any bacteria growing that may cause a UTI. We will call you if the results are positive as well for the STD testing.   Results for orders placed or performed during the hospital encounter of 11/07/23  POCT URINE DIPSTICK   Collection Time: 11/07/23  8:47 AM  Result Value Ref Range   Color, UA yellow yellow   Clarity, UA clear clear   Glucose, UA negative negative mg/dL   Bilirubin, UA negative negative   Ketones, POC UA negative negative mg/dL   Spec Grav, UA 8.979 8.989 - 1.025   Blood, UA trace-intact (A) negative   pH, UA 7.0 5.0 - 8.0   POC PROTEIN,UA trace negative, trace   Urobilinogen, UA 1.0 0.2 or 1.0 E.U./dL   Nitrite, UA Negative Negative  Leukocytes, UA Negative Negative           ED Prescriptions   None     PDMP not reviewed this encounter.   Lindi Carter, PA-C 11/07/23 [phone]

## 2023-11-07 NOTE — Discharge Instructions (Signed)
 Your urine shows trace blood, so I will send it for a culture to see if there is any bacteria growing that may cause a UTI. We will call you if the results are positive as well for the STD testing.   Results for orders placed or performed during the hospital encounter of 11/07/23  POCT URINE DIPSTICK   Collection Time: 11/07/23  8:47 AM  Result Value Ref Range   Color, UA yellow yellow   Clarity, UA clear clear   Glucose, UA negative negative mg/dL   Bilirubin, UA negative negative   Ketones, POC UA negative negative mg/dL   Spec Grav, UA 8.979 8.989 - 1.025   Blood, UA trace-intact (A) negative   pH, UA 7.0 5.0 - 8.0   POC PROTEIN,UA trace negative, trace   Urobilinogen, UA 1.0 0.2 or 1.0 E.U./dL   Nitrite, UA Negative Negative   Leukocytes, UA Negative Negative

## 2023-11-07 NOTE — ED Triage Notes (Signed)
 Pt reports a burning sensation with urination at the tip of the penis after having unprotected sex. Denies penile discharge.Requesting STD testing.

## 2023-11-08 ENCOUNTER — Telehealth: Payer: Self-pay

## 2023-11-08 ENCOUNTER — Ambulatory Visit: Payer: Self-pay | Admitting: Internal Medicine

## 2023-11-08 ENCOUNTER — Ambulatory Visit
Admission: RE | Admit: 2023-11-08 | Discharge: 2023-11-08 | Disposition: A | Discharge status: Home / Self Care | Source: Ambulatory Visit | Attending: Family Medicine | Admitting: Family Medicine

## 2023-11-08 LAB — CYTOLOGY, (ORAL, ANAL, URETHRAL) ANCILLARY ONLY
Chlamydia: NEGATIVE
Comment: NEGATIVE
Comment: NEGATIVE
Comment: NORMAL
Neisseria Gonorrhea: NEGATIVE
Trichomonas: NEGATIVE

## 2023-11-08 MED ORDER — DOXYCYCLINE HYCLATE 100 MG PO CAPS: 100.0000 mg | ORAL_CAPSULE | Freq: Two times a day (BID) | ORAL | 0 refills | Status: AC

## 2023-11-08 NOTE — Telephone Encounter (Signed)
 Patient presents to clinic to have doxy prescription resent to pharmacy. He states he never picked it up from 08/17 visit. Per Ever[facility], [place] can resend to pharmacy. Informed pt once we receive urine culture lab results we will notify if abnormal or if medication needs to be changed. Voiced understanding.

## 2024-01-11 ENCOUNTER — Ambulatory Visit: Admitting: Family Medicine

## 2024-01-11 ENCOUNTER — Encounter: Payer: Self-pay | Admitting: Family Medicine
